# Patient Record
Sex: Female | Born: 1975 | Race: White | Hispanic: No | Marital: Single | Smoking: Current every day smoker
Health system: Southern US, Community
[De-identification: ages and names within clinical notes are randomized; demographics above are authoritative.]

## PROBLEM LIST (undated history)

## (undated) DIAGNOSIS — D649 Anemia, unspecified: Secondary | ICD-10-CM

## (undated) DIAGNOSIS — Z9884 Bariatric surgery status: Secondary | ICD-10-CM

## (undated) DIAGNOSIS — O24419 Gestational diabetes mellitus in pregnancy, unspecified control: Secondary | ICD-10-CM

## (undated) DIAGNOSIS — I25119 Atherosclerotic heart disease of native coronary artery with unspecified angina pectoris: Secondary | ICD-10-CM

## (undated) DIAGNOSIS — E039 Hypothyroidism, unspecified: Secondary | ICD-10-CM

## (undated) DIAGNOSIS — Z951 Presence of aortocoronary bypass graft: Secondary | ICD-10-CM

## (undated) DIAGNOSIS — Z8719 Personal history of other diseases of the digestive system: Secondary | ICD-10-CM

## (undated) DIAGNOSIS — E559 Vitamin D deficiency, unspecified: Secondary | ICD-10-CM

## (undated) DIAGNOSIS — F410 Panic disorder [episodic paroxysmal anxiety] without agoraphobia: Secondary | ICD-10-CM

## (undated) DIAGNOSIS — F419 Anxiety disorder, unspecified: Secondary | ICD-10-CM

## (undated) DIAGNOSIS — F329 Major depressive disorder, single episode, unspecified: Secondary | ICD-10-CM

## (undated) DIAGNOSIS — G8929 Other chronic pain: Secondary | ICD-10-CM

## (undated) DIAGNOSIS — D497 Neoplasm of unspecified behavior of endocrine glands and other parts of nervous system: Secondary | ICD-10-CM

## (undated) DIAGNOSIS — K469 Unspecified abdominal hernia without obstruction or gangrene: Secondary | ICD-10-CM

## (undated) DIAGNOSIS — R519 Headache, unspecified: Secondary | ICD-10-CM

## (undated) DIAGNOSIS — F32A Depression, unspecified: Secondary | ICD-10-CM

## (undated) DIAGNOSIS — Z8711 Personal history of peptic ulcer disease: Secondary | ICD-10-CM

## (undated) DIAGNOSIS — J45909 Unspecified asthma, uncomplicated: Secondary | ICD-10-CM

## (undated) DIAGNOSIS — S32000A Wedge compression fracture of unspecified lumbar vertebra, initial encounter for closed fracture: Secondary | ICD-10-CM

## (undated) DIAGNOSIS — M7071 Other bursitis of hip, right hip: Secondary | ICD-10-CM

## (undated) DIAGNOSIS — Z9289 Personal history of other medical treatment: Secondary | ICD-10-CM

## (undated) DIAGNOSIS — E785 Hyperlipidemia, unspecified: Secondary | ICD-10-CM

## (undated) DIAGNOSIS — R569 Unspecified convulsions: Secondary | ICD-10-CM

## (undated) DIAGNOSIS — K219 Gastro-esophageal reflux disease without esophagitis: Secondary | ICD-10-CM

## (undated) DIAGNOSIS — M549 Dorsalgia, unspecified: Secondary | ICD-10-CM

## (undated) DIAGNOSIS — G43909 Migraine, unspecified, not intractable, without status migrainosus: Secondary | ICD-10-CM

## (undated) DIAGNOSIS — M797 Fibromyalgia: Secondary | ICD-10-CM

## (undated) DIAGNOSIS — R825 Elevated urine levels of drugs, medicaments and biological substances: Secondary | ICD-10-CM

## (undated) DIAGNOSIS — I639 Cerebral infarction, unspecified: Secondary | ICD-10-CM

## (undated) DIAGNOSIS — J42 Unspecified chronic bronchitis: Secondary | ICD-10-CM

## (undated) DIAGNOSIS — M545 Low back pain: Secondary | ICD-10-CM

## (undated) DIAGNOSIS — M1990 Unspecified osteoarthritis, unspecified site: Secondary | ICD-10-CM

## (undated) DIAGNOSIS — Z720 Tobacco use: Secondary | ICD-10-CM

## (undated) DIAGNOSIS — J189 Pneumonia, unspecified organism: Secondary | ICD-10-CM

## (undated) DIAGNOSIS — M7072 Other bursitis of hip, left hip: Secondary | ICD-10-CM

## (undated) DIAGNOSIS — R011 Cardiac murmur, unspecified: Secondary | ICD-10-CM

## (undated) DIAGNOSIS — R51 Headache: Secondary | ICD-10-CM

## (undated) DIAGNOSIS — G4730 Sleep apnea, unspecified: Secondary | ICD-10-CM

## (undated) HISTORY — PX: HERNIA REPAIR: SHX51

## (undated) HISTORY — DX: Cerebral infarction, unspecified: I63.9

## (undated) HISTORY — DX: Hyperlipidemia, unspecified: E78.5

## (undated) HISTORY — DX: Unspecified convulsions: R56.9

## (undated) HISTORY — PX: ESOPHAGOGASTRODUODENOSCOPY: SHX1529

## (undated) HISTORY — PX: TONSILLECTOMY: SUR1361

## (undated) HISTORY — DX: Unspecified asthma, uncomplicated: J45.909

## (undated) HISTORY — DX: Hypothyroidism, unspecified: E03.9

## (undated) HISTORY — PX: ABDOMINAL HERNIA REPAIR: SHX539

---

## 2004-03-14 HISTORY — PX: GASTRIC BYPASS: SHX52

## 2005-03-14 HISTORY — PX: GASTRIC RESTRICTION SURGERY: SHX653

## 2005-03-14 HISTORY — PX: VAGINAL HYSTERECTOMY: SUR661

## 2014-08-14 ENCOUNTER — Emergency Department (HOSPITAL_COMMUNITY): Payer: Medicare Other

## 2014-08-14 ENCOUNTER — Encounter (HOSPITAL_COMMUNITY): Payer: Self-pay

## 2014-08-14 ENCOUNTER — Emergency Department (HOSPITAL_COMMUNITY)
Admission: EM | Admit: 2014-08-14 | Discharge: 2014-08-14 | Disposition: A | Discharge status: Home / Self Care | Payer: Medicare Other | Attending: Emergency Medicine | Admitting: Emergency Medicine

## 2014-08-14 DIAGNOSIS — F410 Panic disorder [episodic paroxysmal anxiety] without agoraphobia: Secondary | ICD-10-CM | POA: Diagnosis not present

## 2014-08-14 DIAGNOSIS — R531 Weakness: Secondary | ICD-10-CM | POA: Insufficient documentation

## 2014-08-14 DIAGNOSIS — Z8719 Personal history of other diseases of the digestive system: Secondary | ICD-10-CM | POA: Diagnosis not present

## 2014-08-14 DIAGNOSIS — R0789 Other chest pain: Secondary | ICD-10-CM | POA: Insufficient documentation

## 2014-08-14 DIAGNOSIS — Z720 Tobacco use: Secondary | ICD-10-CM | POA: Insufficient documentation

## 2014-08-14 DIAGNOSIS — F329 Major depressive disorder, single episode, unspecified: Secondary | ICD-10-CM | POA: Diagnosis not present

## 2014-08-14 DIAGNOSIS — R002 Palpitations: Secondary | ICD-10-CM | POA: Diagnosis not present

## 2014-08-14 DIAGNOSIS — E559 Vitamin D deficiency, unspecified: Secondary | ICD-10-CM | POA: Insufficient documentation

## 2014-08-14 HISTORY — DX: Anxiety disorder, unspecified: F41.9

## 2014-08-14 HISTORY — DX: Vitamin D deficiency, unspecified: E55.9

## 2014-08-14 HISTORY — DX: Depression, unspecified: F32.A

## 2014-08-14 HISTORY — DX: Panic disorder (episodic paroxysmal anxiety): F41.0

## 2014-08-14 HISTORY — DX: Unspecified abdominal hernia without obstruction or gangrene: K46.9

## 2014-08-14 HISTORY — DX: Major depressive disorder, single episode, unspecified: F32.9

## 2014-08-14 LAB — CBC WITH DIFFERENTIAL/PLATELET
BASOS PCT: 0 % (ref 0–1)
Basophils Absolute: 0 10*3/uL (ref 0.0–0.1)
Eosinophils Absolute: 0.4 10*3/uL (ref 0.0–0.7)
Eosinophils Relative: 4 % (ref 0–5)
HCT: 37.1 % (ref 36.0–46.0)
Hemoglobin: 12 g/dL (ref 12.0–15.0)
Lymphocytes Relative: 24 % (ref 12–46)
Lymphs Abs: 2.4 10*3/uL (ref 0.7–4.0)
MCH: 27.5 pg (ref 26.0–34.0)
MCHC: 32.3 g/dL (ref 30.0–36.0)
MCV: 85.1 fL (ref 78.0–100.0)
MONOS PCT: 7 % (ref 3–12)
Monocytes Absolute: 0.7 10*3/uL (ref 0.1–1.0)
NEUTROS ABS: 6.4 10*3/uL (ref 1.7–7.7)
Neutrophils Relative %: 65 % (ref 43–77)
Platelets: 391 10*3/uL (ref 150–400)
RBC: 4.36 MIL/uL (ref 3.87–5.11)
RDW: 16.1 % — ABNORMAL HIGH (ref 11.5–15.5)
WBC: 9.9 10*3/uL (ref 4.0–10.5)

## 2014-08-14 LAB — RAPID URINE DRUG SCREEN, HOSP PERFORMED
AMPHETAMINES: POSITIVE — AB
BARBITURATES: NOT DETECTED
Benzodiazepines: NOT DETECTED
Cocaine: NOT DETECTED
OPIATES: NOT DETECTED
TETRAHYDROCANNABINOL: POSITIVE — AB

## 2014-08-14 LAB — URINALYSIS, DIPSTICK ONLY
BILIRUBIN URINE: NEGATIVE
Glucose, UA: NEGATIVE mg/dL
Hgb urine dipstick: NEGATIVE
Ketones, ur: NEGATIVE mg/dL
LEUKOCYTES UA: NEGATIVE
Nitrite: NEGATIVE
PH: 7 (ref 5.0–8.0)
Protein, ur: NEGATIVE mg/dL
Specific Gravity, Urine: 1.023 (ref 1.005–1.030)
Urobilinogen, UA: 1 mg/dL (ref 0.0–1.0)

## 2014-08-14 LAB — COMPREHENSIVE METABOLIC PANEL
ALK PHOS: 119 U/L (ref 38–126)
ALT: 13 U/L — AB (ref 14–54)
AST: 20 U/L (ref 15–41)
Albumin: 4 g/dL (ref 3.5–5.0)
Anion gap: 12 (ref 5–15)
BUN: 20 mg/dL (ref 6–20)
CALCIUM: 9.3 mg/dL (ref 8.9–10.3)
CO2: 21 mmol/L — ABNORMAL LOW (ref 22–32)
CREATININE: 0.81 mg/dL (ref 0.44–1.00)
Chloride: 106 mmol/L (ref 101–111)
Glucose, Bld: 83 mg/dL (ref 65–99)
Potassium: 4.2 mmol/L (ref 3.5–5.1)
Sodium: 139 mmol/L (ref 135–145)
Total Bilirubin: 0.4 mg/dL (ref 0.3–1.2)
Total Protein: 8.2 g/dL — ABNORMAL HIGH (ref 6.5–8.1)

## 2014-08-14 LAB — I-STAT TROPONIN, ED: Troponin i, poc: 0 ng/mL (ref 0.00–0.08)

## 2014-08-14 MED ORDER — LORAZEPAM 1 MG PO TABS
1.0000 mg | ORAL_TABLET | Freq: Once | ORAL | Status: AC
  Administered 2014-08-14: 1 mg via ORAL
  Filled 2014-08-14: qty 1

## 2014-08-14 MED ORDER — LORAZEPAM 1 MG PO TABS: 1.0000 mg | ORAL_TABLET | Freq: Three times a day (TID) | ORAL | Status: DC | PRN

## 2014-08-14 NOTE — ED Provider Notes (Signed)
CSN: [id number]     Arrival date & time 08/14/14  1029 History   First MD Initiated Contact with Patient 08/14/14 1101     Chief Complaint  Patient presents with  . Palpitations     (Consider location/radiation/quality/duration/timing/severity/associated sxs/prior Treatment) HPI Candice Butler is a 39 y.o. 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 Patient states these vibrations started last night when she is trying to go to sleep. She feels tingly all over. She denies any particular pain, no shortness of breath. Feels like her heart is racing. She states she feels pulsations along the body. Patient reports moving from [place] recently, "to change life." Patient states while in [place] she was being treated for all of her complaints by primary care doctor. Patient states she was on chronic pain medications which included daily Dilaudid, morphine, Norco, and was taking Klonopin for anxiety. Patient states when she moved here she found a primary care doctor who refused to prescribe her those medications and eventually discharge her from her practice. Patient states she has not had those medications in 3 months.  Past Medical History  Diagnosis Date  . Panic attacks   . Anxiety   . Depression   . Hernia of abdominal cavity   . Vitamin D deficiency    Past Surgical History  Procedure Laterality Date  . Hernia repair    . Gastric bypass    . Abdominal hysterectomy    . Esophagogastroduodenoscopy     Family History  Problem Relation Age of Onset  . Heart failure Father   . Heart failure Sister    History  Substance Use Topics  . Smoking status: Current Every Day Smoker -- 1.00 packs/day    Types:  Cigarettes  . Smokeless tobacco: Never Used  . Alcohol Use: Yes     Comment: occasionally   OB History    No data available     Review of Systems  Constitutional: Positive for fatigue. Negative for fever and chills.  Respiratory: Positive for chest tightness. Negative for cough and shortness of breath.   Cardiovascular: Positive for chest pain and palpitations. Negative for leg swelling.  Gastrointestinal: Negative for nausea, vomiting, abdominal pain and diarrhea.  Genitourinary: Negative for dysuria and flank pain.  Musculoskeletal: Negative for myalgias, arthralgias, neck pain and neck stiffness.  Skin: Negative for rash.  Neurological: Positive for weakness. Negative for dizziness and headaches.  All other systems reviewed and are negative.     Allergies  Review of patient's allergies indicates not on file.  Home Medications   Prior to Admission medications   Not on File   BP 117/75 mmHg  Pulse 87  Temp(Src) 98.2 F (36.8 C)  Resp 18  Ht 5\' 7"  (1.702 m)  Wt 185 lb (83.915 kg)  BMI 28.97 kg/m2  SpO2 96% Physical Exam  Constitutional: She is oriented to person, place, and time. She appears well-developed and well-nourished. No distress.  HENT:  Head: Normocephalic.  Eyes: Conjunctivae are normal.  Neck: Neck supple.  Cardiovascular: Normal rate, regular rhythm and normal heart sounds.   Pulmonary/Chest: Effort normal and breath sounds normal. No respiratory distress. She has no wheezes. She has no rales.  Abdominal: Soft. Bowel sounds are normal. She exhibits no distension. There  is no tenderness. There is no rebound.  Musculoskeletal: She exhibits no edema.  Neurological: She is alert and oriented to person, place, and time.  Skin: Skin is warm and dry.  Psychiatric: She has a normal mood and affect. Her behavior is normal.  Nursing note and vitals reviewed.   ED Course  Procedures (including critical care time) Labs Review Labs Reviewed  CBC WITH  DIFFERENTIAL/PLATELET - Abnormal; Notable for the following:    RDW 16.1 (*)    All other components within normal limits  COMPREHENSIVE METABOLIC PANEL - Abnormal; Notable for the following:    CO2 21 (*)    Total Protein 8.2 (*)    ALT 13 (*)    All other components within normal limits  URINE RAPID DRUG SCREEN (HOSP PERFORMED) NOT AT [facility] - Abnormal; Notable for the following:    Amphetamines POSITIVE (*)    Tetrahydrocannabinol POSITIVE (*)    All other components within normal limits  URINALYSIS, DIPSTICK ONLY  I-STAT TROPOININ, ED    Imaging Review Dg Chest 2 View  08/14/2014   CLINICAL DATA:  Cardiac palpitations  EXAM: CHEST  2 VIEW  COMPARISON:  None.  FINDINGS: Lungs are clear. The heart size and pulmonary vascularity are normal. No adenopathy. There are surgical clips in the upper abdomen. There is mild sclerosis in the right proximal humerus.  IMPRESSION: No edema or consolidation. Mild sclerosis in the right proximal humerus. 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   Electronically Signed   By: Lowella Grip III M.D.   On: 08/14/2014 12:14     EKG Interpretation None      MDM   Final diagnoses:  Palpitations     patient has multiple complaints, main complaint is vibration sensation and palpitations. She states vibration is in the neck, chest, abdomen, private area. Patient appears to be anxious, she has flat affect. Some facial twitching noted as well. Patient apparently has been on morphine, Lortab, soma, Clonopin at home for 7 years in [place], now no one is willing to prescribe that to her. I have explained to her she'll need to get it into the pain medicine clinic. To also need to follow with psychiatry to get back on her mental health medications. Patient gives multiple bizarre symptoms, states she had gastric bypass and the physician "was narcoleptic and fell asleep during my surgery, and dropped in the clivus which is now lodged behind the heart." Chest x-ray shows no such  finding. Patient also complaining of multiple back fractures, pituitary tumor, multiple other medical diagnoses. I explained to her if she has such a wide list of medical problems she will need to help primary care doctor who can follow-up with her. Patient agreed. Her workup in emergency department is unremarkable, specifically I cannot explain her tingling sensation. I have given her some referrals and resources guide for close follow-up. 10 tablets of Ativan will be given for her anxiety. I have also provided her with a referral to pain management and psychiatry. VS normal at this time. Pt is stable for dc.   Filed Vitals:   08/14/14 1034 08/14/14 1152 08/14/14 1313 08/14/14 1411  BP: 139/89 117/75 116/74 102/78  Pulse: 120 87 79 80  Temp: 98.2 F (36.8 C)   98.1 F (36.7 C)  TempSrc:    Oral  Resp: 22 18  14   Height: 5\' 7"  (1.702 m)     Weight: 185 lb (83.915 kg)     SpO2: 100% 96% 97% 98%  Jeannett Senior, PA-C 08/16/14 [place], MD 08/16/14 2241

## 2014-08-14 NOTE — ED Notes (Signed)
Pt aware of urine sample 

## 2014-08-14 NOTE — ED Notes (Signed)
Patient states she has been out of her meds x 2 months and has been having palpitations, anxiety, panic attacks.

## 2014-08-14 NOTE — Discharge Instructions (Signed)
Avoid using any drugs. Ativan as prescribed as needed for palpitations. Follow up with primary care doctor.   Generalized Anxiety Disorder Generalized anxiety disorder (GAD) is a mental disorder. It interferes with life functions, including relationships, work, and school. GAD is different from normal anxiety, which everyone experiences at some point in their lives in response to specific life events and activities. Normal anxiety actually helps [place] prepare for and get through these life events and activities. Normal anxiety goes away after the event or activity is over.  GAD causes anxiety that is not necessarily related to specific events or activities. It also causes excess anxiety in proportion to specific events or activities. The anxiety associated with GAD is also difficult to control. GAD can vary from mild to severe. People with severe GAD can have intense waves of anxiety with physical symptoms (panic attacks).  SYMPTOMS The anxiety and worry associated with GAD are difficult to control. This anxiety and worry are related to many life events and activities and also occur more days than not for 6 months or longer. People with GAD also have three or more of the following symptoms (one or more in children):  Restlessness.   Fatigue.  Difficulty concentrating.   Irritability.  Muscle tension.  Difficulty sleeping or unsatisfying sleep. DIAGNOSIS GAD is diagnosed through an assessment by your health care provider. Your health care provider will ask you questions aboutyour mood,physical symptoms, and events in your life. Your health care provider may ask you about your medical history and use of alcohol or drugs, including prescription medicines. Your health care provider may also do a physical exam and blood tests. Certain medical conditions and the use of certain substances can cause symptoms similar to those associated with GAD. Your health care provider may refer you to a mental  health specialist for further evaluation. TREATMENT The following therapies are usually used to treat GAD:   Medication. Antidepressant medication usually is prescribed for long-term daily control. Antianxiety medicines may be added in severe cases, especially when panic attacks occur.   Talk therapy (psychotherapy). Certain types of talk therapy can be helpful in treating GAD by providing support, education, and guidance. A form of talk therapy called cognitive behavioral therapy can teach you healthy ways to think about and react to daily life events and activities.  Stress managementtechniques. These include yoga, meditation, and exercise and can be very helpful when they are practiced regularly. A mental health specialist can help determine which treatment is best for you. Some people see improvement with one therapy. However, other people require a combination of therapies. Document Released: 06/25/2012 Document Revised: 07/15/2013 Document Reviewed: 06/25/2012 [facility] Patient Information 2015 [place], [place]. This information is not intended to replace advice given to you by your health care provider. Make sure you discuss any questions you have with your health care provider. Palpitations A palpitation is the feeling that your heartbeat is irregular or is faster than normal. It may feel like your heart is fluttering or skipping a beat. Palpitations are usually not a serious problem. However, in some cases, you may need further medical evaluation. CAUSES  Palpitations can be caused by:  Smoking.  Caffeine or other stimulants, such as diet pills or energy drinks.  Alcohol.  Stress and anxiety.  Strenuous physical activity.  Fatigue.  Certain medicines.  Heart disease, especially if you have a history of irregular heart rhythms (arrhythmias), such as atrial fibrillation, atrial flutter, or supraventricular tachycardia.  An improperly working pacemaker or  defibrillator. DIAGNOSIS  To find the cause of your palpitations, your health care provider will take your medical history and perform a physical exam. Your health care provider may also have you take a test called an ambulatory electrocardiogram (ECG). An ECG records your heartbeat patterns over a 24-hour period. You may also have other tests, such as:  Transthoracic echocardiogram (TTE). During echocardiography, sound waves are used to evaluate how blood flows through your heart.  Transesophageal echocardiogram (TEE).  Cardiac monitoring. This allows your health care provider to monitor your heart rate and rhythm in real time.  Holter monitor. This is a portable device that records your heartbeat and can help diagnose heart arrhythmias. It allows your health care provider to track your heart activity for several days, if needed.  Stress tests by exercise or by giving medicine that makes the heart beat faster. TREATMENT  Treatment of palpitations depends on the cause of your symptoms and can vary greatly. Most cases of palpitations do not require any treatment other than time, relaxation, and monitoring your symptoms. Other causes, such as atrial fibrillation, atrial flutter, or supraventricular tachycardia, usually require further treatment. HOME CARE INSTRUCTIONS   Avoid:  Caffeinated coffee, tea, soft drinks, diet pills, and energy drinks.  Chocolate.  Alcohol.  Stop smoking if you smoke.  Reduce your stress and anxiety. Things that can help you relax include:  A method of controlling things in your body, such as your heartbeats, with your mind (biofeedback).  Yoga.  Meditation.  Physical activity such as swimming, jogging, or walking.  Get plenty of rest and sleep. SEEK MEDICAL CARE IF:   You continue to have a fast or irregular heartbeat beyond 24 hours.  Your palpitations occur more often. SEEK IMMEDIATE MEDICAL CARE IF:  You have chest pain or shortness of  breath.  You have a severe headache.  You feel dizzy or you faint. MAKE SURE YOU:  Understand these instructions.  Will watch your condition.  Will get help right away if you are not doing well or get worse. Document Released: 02/26/2000 Document Revised: 03/05/2013 Document Reviewed: 04/29/2011 [facility] Patient Information 2015 [place], [place]. This information is not intended to replace advice given to you by your health care provider. Make sure you discuss any questions you have with your health care provider.

## 2014-08-26 ENCOUNTER — Ambulatory Visit: Payer: Medicare Other | Attending: Family Medicine | Admitting: Family Medicine

## 2014-08-26 ENCOUNTER — Encounter: Payer: Self-pay | Admitting: Family Medicine

## 2014-08-26 ENCOUNTER — Other Ambulatory Visit: Payer: Self-pay

## 2014-08-26 VITALS — BP 128/89 | HR 95 | Temp 98.2°F | Resp 18 | Ht 67.0 in | Wt 222.2 lb

## 2014-08-26 DIAGNOSIS — R002 Palpitations: Secondary | ICD-10-CM | POA: Diagnosis present

## 2014-08-26 DIAGNOSIS — Z8673 Personal history of transient ischemic attack (TIA), and cerebral infarction without residual deficits: Secondary | ICD-10-CM | POA: Insufficient documentation

## 2014-08-26 DIAGNOSIS — R1030 Lower abdominal pain, unspecified: Secondary | ICD-10-CM | POA: Insufficient documentation

## 2014-08-26 DIAGNOSIS — R109 Unspecified abdominal pain: Secondary | ICD-10-CM | POA: Insufficient documentation

## 2014-08-26 MED ORDER — ESOMEPRAZOLE MAGNESIUM 40 MG PO CPDR: 40.0000 mg | DELAYED_RELEASE_CAPSULE | Freq: Two times a day (BID) | ORAL | Status: DC

## 2014-08-26 NOTE — Progress Notes (Signed)
Candice Butler, is a 39 y.o. female  LKG:[id number]  GUY:[id number]  DOB - 10/16/1975  CC:  Chief Complaint  Patient presents with  . Spasms       HPI: Candice Butler is a 39 y.o. female here today to establish medical care.She has a complicated history including chronic pain, anxiety, vitamin D def. GERD. She has had a couple of seizures several years ago and has a history of a stroke. Multiple abdominal surgeries including a GBP and reversal. She reports several hernies and history of a pituiatary tumor, multiple back injuries. She has some type of cardiac problem and was seen by a cardiologist in [place].Marland Kitchen She was seen in ED for "vibrations throughout her trunk, which she reports continues. She reports no one can see or feel these vibrations or pulsations and no diagnosis was made in ED. She was given Ativan for her anxiety and told to follow-up here. She is recently here from [place]. She has been on Dilaudid, MS, Narco for pain and Klonopin for anxiety. She has also been on Soma for spasms. She is currently taking per self report Albuterol prn, folic acid, zofran, topamax and Ambien. She request prescriptions for Soma, Klonipin and Nexium.While in ED she had a urine drug screen positive for amphetamines and cannabis. She reports not having her pain and anxiety medications for 3 months until she was given the Ativan in ED. Allergies  Allergen Reactions  . Bee Venom Anaphylaxis  . Ciprofloxacin Shortness Of Breath  . Ibuprofen Shortness Of Breath and Other (See Comments)    Throat closed up  . Antihistamines, Chlorpheniramine-Type Other (See Comments)    Throat closed up  . Aspirin Other (See Comments)    ulcers  . Boniva [Ibandronic Acid] Other (See Comments)    Hand swelling, muscles were sore  . Quetiapine Hives and Other (See Comments)    Hives, trouble breathing, throat closed up  . Sulfa Antibiotics Other (See Comments)    Throat closed up   Past Medical History  Diagnosis Date  .  Panic attacks   . Anxiety   . Depression   . Hernia of abdominal cavity   . Vitamin D deficiency    Current Outpatient Prescriptions on File Prior to Visit  Medication Sig Dispense Refill  . albuterol (PROVENTIL HFA;VENTOLIN HFA) 108 (90 BASE) MCG/ACT inhaler Inhale 2 puffs into the lungs every 6 (six) hours as needed for wheezing or shortness of breath.    . folic acid (FOLVITE) 1 MG tablet Take 1 mg by mouth daily.  1  . ondansetron (ZOFRAN-ODT) 8 MG disintegrating tablet Take 1 tablet by mouth every 8 (eight) hours as needed. n/v  0  . topiramate (TOPAMAX) 50 MG tablet Take 1 tablet by mouth 3 (three) times daily.  0  . zolpidem (AMBIEN) 10 MG tablet Take 10 mg by mouth at bedtime as needed. for sleep  0  . amitriptyline (ELAVIL) 50 MG tablet Take 3 tablets by mouth daily.  0  . carisoprodol (SOMA) 350 MG tablet Take 350 mg by mouth 3 (three) times daily.  0  . cholecalciferol (VITAMIN D) 1000 UNITS tablet Take 1,000 Units by mouth daily.    . citalopram (CELEXA) 10 MG tablet Take 1 tablet by mouth daily.    . clonazePAM (KLONOPIN) 1 MG tablet Take 1 tablet by mouth 4 (four) times daily as needed. anxiety  0  . CRESTOR 5 MG tablet Take 5 mg by mouth daily.  11  .  Cyanocobalamin 1000 MCG/ML KIT Inject 1,000 mcg as directed every 30 (thirty) days.    . ferrous sulfate 325 (65 FE) MG tablet Take 325 mg by mouth 2 (two) times daily with a meal.    . iron sucrose in sodium chloride 0.9 % 150 mL Inject 200 mg into the vein once a week.    . KLOR-CON M20 20 MEQ tablet Take 20 mEq by mouth daily.  11  . levothyroxine (SYNTHROID, LEVOTHROID) 75 MCG tablet Take 75 mcg by mouth daily.  0  . LORazepam (ATIVAN) 1 MG tablet Take 1 tablet (1 mg total) by mouth 3 (three) times daily as needed for anxiety. (Patient not taking: Reported on 08/26/2014) 10 tablet 0  . NEXIUM 40 MG capsule Take 1 capsule by mouth 2 (two) times daily.  0  . Vitamin D, Ergocalciferol, (DRISDOL) 50000 UNITS CAPS capsule Take 1  capsule by mouth once a week.  2   No current facility-administered medications on file prior to visit.   Family History  Problem Relation Age of Onset  . Heart failure Father   . Heart failure Sister    History   Social History  . Marital Status: Single    Spouse Name: N/A  . Number of Children: N/A  . Years of Education: N/A   Occupational History  . Not on file.   Social History Main Topics  . Smoking status: Current Every Day Smoker -- 1.00 packs/day    Types: Cigarettes  . Smokeless tobacco: Never Used  . Alcohol Use: Yes     Comment: occasionally  . Drug Use: No  . Sexual Activity: Not on file   Other Topics Concern  . Not on file   Social History Narrative    Review of Systems: Constitutional: Negative for fever, chills, appetite change, weight loss. Positive for  fatigue. HENT: Negative for ear pain, ear discharge.nose bleeds Eyes: Negative for pain, discharge, redness, itching and visual disturbance. Neck: Negative for pain, stiffness Respiratory: Positive for  cough, shortness of breath with exercise (asthma) Cardiovascular: Positive  for chest pain, palpitations and leg swelling. Gastrointestinal: Positive for abdominal distention, abdominal pain, nausea, vomiting, diarrhea. Genitourinary: Negative for dysuria,  frequency, hematuria, flank pain, Positive for urgency and occassional incontenience Musculoskeletal: Negative for back pain, joint pain, joint  swelling, arthralgia and gait problem.Negative for weakness.Positive for hip pain Neurological: Negative for dizziness, tremors, seizures, syncope,  numbness and headaches. Positive for perceived tremors, Positve for history of seizures, lightheadedness Hematological: Negative for easy bruising or bleeding Psychiatric/Behavioral: Positive for for depression, anxiety, decreased concentration, confusion   Objective:   Filed Vitals:   08/26/14 1024  BP: 128/89  Pulse: 95  Temp: 98.2 F (36.8 C)  Resp:  18    Physical Exam: Constitutional: Patient appears well-developed and well-nourished. No distress.She sits on the table rocking as she says this helps with the vibratory sensations. HENT: Normocephalic, atraumatic, External right and left ear normal. Oropharynx is clear and moist.  Eyes: Conjunctivae and EOM are normal. PERRLA, no scleral icterus. Neck: Normal ROM. Neck supple. No lymphadenopathy, No thyromegaly. CVS: RRR, S1/S2 +, no murmurs, no gallops, no rubs Pulmonary: Effort and breath sounds normal, no stridor, rhonchi, wheezes, rales.  Abdominal: Soft. Normoactive BS,, no distension, tenderness, rebound or guarding.  Musculoskeletal: Normal range of motion. No edema and no tenderness.  Neuro: Alert.Normal muscle tone coordination. Non-focal Skin: Skin is warm and dry. No rash noted. Not diaphoretic. No erythema. No pallor. Psychiatric: Normal mood and   affect. Behavior, judgment, thought content normal.  Lab Results  Component Value Date   WBC 9.9 08/14/2014   HGB 12.0 08/14/2014   HCT 37.1 08/14/2014   MCV 85.1 08/14/2014   PLT 391 08/14/2014   Lab Results  Component Value Date   CREATININE 0.81 08/14/2014   BUN 20 08/14/2014   NA 139 08/14/2014   K 4.2 08/14/2014   CL 106 08/14/2014   CO2 21* 08/14/2014    No results found for: HGBA1C Lipid Panel  No results found for: CHOL, TRIG, HDL, CHOLHDL, VLDL, LDLCALC     Assessment and plan:   1. Anxiety ? Cause of palpitations and vibratory sensation 2. Chronic pain  - I have explained that we do not prescribe narcotics at all and as a general rule do not prescribe medications for anxiety - Certainly I would not prescribe on the first visit. - Have had Roselyn Reef talk with her and she has recommended several sources for treatmnt for mental health issues.  GERD - Nexium 40 mg # 60 with 3 refills.  I have recommended she make an appointment with assigned PCP at earliest appt possible. She does have an appt with Dr.  Adrian Blackwater on June 29.        The patient was given clear instructions to go to ER or return to medical center if symptoms don't improve, worsen or new problems develop. The patient verbalized understanding. The patient was told to call to get lab results if they haven't heard anything in the next week.     This note has been created with Surveyor, quantity. Any transcriptional errors are unintentional.    Micheline Chapman, MSN, FNP-BC [employer] [facility], [place]   08/26/2014, 2:08 PM

## 2014-08-26 NOTE — Patient Instructions (Signed)
Make an appointment as soon as possible with provider here Try to get in to see mental health professional ASAP If necessary return ED before being seen here again.

## 2014-08-26 NOTE — Progress Notes (Signed)
Patient here for ED follow up where she was diagnosed with palpitations.  Patient complains of "vibrations/spasms" all over body that have been continuous since ED visit. Patient reports the worst discomfort/cramping is in lower abdomen/groin area as 6/10.  Patient denies chest pain, chest discomfort, chest tightness.  Patient currently smokes 1ppd and is not ready to quit smoking.  Patient requests refills on Carisoprodol, Clonazepam, and Nexium to help "calm the spasms."

## 2014-08-28 ENCOUNTER — Other Ambulatory Visit: Payer: Self-pay | Admitting: Family Medicine

## 2014-09-10 ENCOUNTER — Ambulatory Visit: Payer: Medicare Other | Attending: Family Medicine | Admitting: Family Medicine

## 2014-09-10 ENCOUNTER — Encounter: Payer: Self-pay | Admitting: Family Medicine

## 2014-09-10 VITALS — BP 124/85 | HR 107 | Temp 98.5°F | Resp 18 | Ht 67.0 in | Wt 225.0 lb

## 2014-09-10 DIAGNOSIS — R002 Palpitations: Secondary | ICD-10-CM | POA: Insufficient documentation

## 2014-09-10 DIAGNOSIS — R825 Elevated urine levels of drugs, medicaments and biological substances: Secondary | ICD-10-CM | POA: Diagnosis not present

## 2014-09-10 DIAGNOSIS — E039 Hypothyroidism, unspecified: Secondary | ICD-10-CM

## 2014-09-10 DIAGNOSIS — Z9884 Bariatric surgery status: Secondary | ICD-10-CM | POA: Diagnosis not present

## 2014-09-10 DIAGNOSIS — F17200 Nicotine dependence, unspecified, uncomplicated: Secondary | ICD-10-CM

## 2014-09-10 DIAGNOSIS — E559 Vitamin D deficiency, unspecified: Secondary | ICD-10-CM | POA: Insufficient documentation

## 2014-09-10 DIAGNOSIS — Z720 Tobacco use: Secondary | ICD-10-CM | POA: Insufficient documentation

## 2014-09-10 DIAGNOSIS — E785 Hyperlipidemia, unspecified: Secondary | ICD-10-CM | POA: Insufficient documentation

## 2014-09-10 HISTORY — DX: Bariatric surgery status: Z98.84

## 2014-09-10 HISTORY — DX: Hypothyroidism, unspecified: E03.9

## 2014-09-10 HISTORY — DX: Hyperlipidemia, unspecified: E78.5

## 2014-09-10 HISTORY — DX: Elevated urine levels of drugs, medicaments and biological substances: R82.5

## 2014-09-10 LAB — LIPID PANEL
Cholesterol: 530 mg/dL — ABNORMAL HIGH (ref 0–200)
HDL: 47 mg/dL (ref >=46)
LDL CALC: 433 mg/dL — AB (ref 0–99)
Total CHOL/HDL Ratio: 11.3 Ratio
Triglycerides: 249 mg/dL — ABNORMAL HIGH (ref <150)
VLDL: 50 mg/dL — ABNORMAL HIGH (ref 0–40)

## 2014-09-10 LAB — TSH: TSH: 0.567 u[IU]/mL (ref 0.350–4.500)

## 2014-09-10 MED ORDER — LEVOTHYROXINE SODIUM 75 MCG PO TABS: 75.0000 ug | ORAL_TABLET | Freq: Every day | ORAL | Status: DC

## 2014-09-10 NOTE — Patient Instructions (Addendum)
Ms. Sebastiani,  Thank you for coming in today.  Thank you for coming in today. It was a pleasure meeting you. I look forward to being your primary doctor.   1. Palpitations: normal heart and lung exam today except for elevated heart rate  08/14/2014 U tox positive for amphetamines and marijuana   Rechecking urine drug screen to rule out drugs of abuse Checking TSH to rule out over treated hypothyroidism  Recommend decaf coffee  I have refilled synthroid at current dose, but you will be called if TSH is abnormal which will require a dose adjustment   2. Also checking vit D and cholesterol  3. Pain treatment: options are tylenol #3 or tramadol for chronic pain in this office. We do not prescribe pain medicine along with chronic benzodiazepine use.   You will be called with lab results  F/u in 3-4 weeks for pain concerns   Dr. Adrian Blackwater

## 2014-09-10 NOTE — Progress Notes (Signed)
Establish Care with PCP Complaining of palpitation and body vibration

## 2014-09-10 NOTE — Assessment & Plan Note (Addendum)
A: hypothyroidism Med: compliant with synthroid  P:  Check TSH Refilled synthroid   Normal thyroid continue current dose of synthroid
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
## 2014-09-10 NOTE — Assessment & Plan Note (Signed)
Palpitations: normal heart and lung exam today except for elevated heart rate  08/14/2014 U tox positive for amphetamines and marijuana   Rechecking urine drug screen to rule out drugs of abuse Checking TSH to rule out over treated hypothyroidism  Recommend decaf coffee

## 2014-09-10 NOTE — Assessment & Plan Note (Addendum)
Palpitations: normal heart and lung exam today except for elevated heart rate  08/14/2014 U tox positive for amphetamines and marijuana   Rechecking urine drug screen to rule out drugs of abuse  UDS positive for marijuana, negative for amphetamines, recommend marijuana avoidance

## 2014-09-11 LAB — VITAMIN D 25 HYDROXY (VIT D DEFICIENCY, FRACTURES): VIT D 25 HYDROXY: 14 ng/mL — AB (ref 30–100)

## 2014-09-12 ENCOUNTER — Telehealth: Payer: Self-pay

## 2014-09-12 DIAGNOSIS — K219 Gastro-esophageal reflux disease without esophagitis: Secondary | ICD-10-CM

## 2014-09-12 NOTE — Telephone Encounter (Signed)
Nurse called patient, patient verified date of birth. Nurse called patient because insurance company sent letter explaining patients esomeprazole is only filled for 30 day supply and Insurance is requesting patient use Nexium DR or omeprazole. Patient explains she can only take "Nexium, the purple pill" because omeprazole makes her sick. Patient reports trying other medications in the past and she gets extremely sick with any other similar medication. Patient requesting klonopin for tremors. Medication last filled in [place]. Patient recently relocated here.

## 2014-09-13 LAB — DRUG SCREEN, URINE
AMPHETAMINE SCRN UR: NEGATIVE
BENZODIAZEPINES.: NEGATIVE
Barbiturate Quant, Ur: NEGATIVE
Cocaine Metabolites: NEGATIVE
Creatinine,U: 69.27 mg/dL
METHADONE: NEGATIVE
Marijuana Metabolite: POSITIVE — AB
Opiates: NEGATIVE
Phencyclidine (PCP): NEGATIVE
Propoxyphene: NEGATIVE

## 2014-09-16 DIAGNOSIS — K219 Gastro-esophageal reflux disease without esophagitis: Secondary | ICD-10-CM | POA: Insufficient documentation

## 2014-09-16 MED ORDER — NEXIUM 40 MG PO CPDR: 40.0000 mg | DELAYED_RELEASE_CAPSULE | Freq: Two times a day (BID) | ORAL | Status: DC

## 2014-09-16 MED ORDER — VITAMIN D (ERGOCALCIFEROL) 1.25 MG (50000 UNIT) PO CAPS: 50000.0000 [IU] | ORAL_CAPSULE | ORAL | Status: DC

## 2014-09-16 MED ORDER — ATORVASTATIN CALCIUM 40 MG PO TABS: 40.0000 mg | ORAL_TABLET | Freq: Every day | ORAL | Status: DC

## 2014-09-16 MED ORDER — VITAMIN D3 50 MCG (2000 UT) PO TABS: 2000.0000 [IU] | ORAL_TABLET | Freq: Every day | ORAL | Status: DC

## 2014-09-16 NOTE — Telephone Encounter (Signed)
LVM to return call.

## 2014-09-16 NOTE — Addendum Note (Signed)
Addended by: Boykin Nearing on: 09/16/2014 11:50 AM   Modules accepted: Orders, Medications

## 2014-09-16 NOTE — Telephone Encounter (Signed)
Reviewed Sent in nexium  Please inform patient that Dr. Adrian Blackwater does not prescribe klonopin or any BZ for tremor, options are beta blocker or if tremor due to anxiety possibly adding atarax to the celexa she already takes.    LVM to return call, no information left on VM

## 2014-09-16 NOTE — Telephone Encounter (Signed)
Reviewed Sent in nexium  Please inform patient that Dr. Adrian Blackwater does not prescribe klonopin or any BZ for tremor, options are beta blocker or if tremor due to anxiety possibly adding atarax to the celexa she already takes.

## 2014-09-16 NOTE — Assessment & Plan Note (Signed)
Very high cholesterol, we do not have crestor in our pharmacy ordered  lipitor 40 mg daily

## 2014-09-16 NOTE — Assessment & Plan Note (Addendum)
  Vit D def take vit D supplement of 50 K U weekly for 8 weeks then 2 K U daily of D3, vit D deficiency can cause palpitations

## 2014-09-16 NOTE — Telephone Encounter (Signed)
-----   Message from Boykin Nearing, MD sent at 09/16/2014 11:46 AM EDT ----- UDS positive for marijuana, negative for amphetamines, recommend marijuana avoidance Vit D def take vit D supplement of 50 K U weekly for 8 weeks then 2 K U daily of D3, vit D deficiency can cause palpitations  Normal thyroid continue current dose of synthroid  Very high cholesterol, we do not have crestor in our pharmacy ordered  lipitor 40 mg daily

## 2014-10-02 ENCOUNTER — Telehealth: Payer: Self-pay | Admitting: *Deleted

## 2014-10-02 DIAGNOSIS — F411 Generalized anxiety disorder: Secondary | ICD-10-CM

## 2014-10-03 DIAGNOSIS — F411 Generalized anxiety disorder: Secondary | ICD-10-CM | POA: Insufficient documentation

## 2014-10-03 NOTE — Assessment & Plan Note (Signed)
Pt wants a referral to psychiatrist who will prescribe her Klonopin; Dr. Darleene Cleaver will prescribe, if needed. She would need written referral from PCP

## 2014-10-03 NOTE — Telephone Encounter (Signed)
Referral placed, please inform patient

## 2014-10-06 ENCOUNTER — Telehealth: Payer: Self-pay

## 2014-10-06 NOTE — Telephone Encounter (Signed)
Patient called to request her blood work results, please f/u with pt.  °

## 2014-10-09 NOTE — Telephone Encounter (Signed)
UDS positive for marijuana, negative for amphetamines, recommend marijuana avoidance Vit D def take vit D supplement of 50 K U weekly for 8 weeks then 2 K U daily of D3, vit D deficiency can cause palpitations  Normal thyroid continue current dose of synthroid  Very high cholesterol, we do not have crestor in our pharmacy ordered lipitor 40 mg daily.    Pt aware of results

## 2014-10-14 ENCOUNTER — Encounter: Payer: Self-pay | Admitting: Family Medicine

## 2014-10-14 ENCOUNTER — Ambulatory Visit: Payer: Medicare Other | Attending: Family Medicine | Admitting: Family Medicine

## 2014-10-14 VITALS — BP 113/74 | HR 87 | Temp 98.9°F | Resp 16 | Ht 67.0 in | Wt 237.0 lb

## 2014-10-14 DIAGNOSIS — M545 Low back pain, unspecified: Secondary | ICD-10-CM

## 2014-10-14 DIAGNOSIS — G8929 Other chronic pain: Secondary | ICD-10-CM

## 2014-10-14 DIAGNOSIS — F909 Attention-deficit hyperactivity disorder, unspecified type: Secondary | ICD-10-CM | POA: Diagnosis not present

## 2014-10-14 DIAGNOSIS — R251 Tremor, unspecified: Secondary | ICD-10-CM | POA: Diagnosis not present

## 2014-10-14 DIAGNOSIS — F411 Generalized anxiety disorder: Secondary | ICD-10-CM

## 2014-10-14 DIAGNOSIS — F17210 Nicotine dependence, cigarettes, uncomplicated: Secondary | ICD-10-CM | POA: Insufficient documentation

## 2014-10-14 HISTORY — DX: Low back pain, unspecified: M54.50

## 2014-10-14 HISTORY — DX: Low back pain, unspecified: G89.29

## 2014-10-14 MED ORDER — CARISOPRODOL 350 MG PO TABS: 350.0000 mg | ORAL_TABLET | Freq: Three times a day (TID) | ORAL | Status: DC | PRN

## 2014-10-14 MED ORDER — ACETAMINOPHEN-CODEINE #3 300-30 MG PO TABS: 1.0000 | ORAL_TABLET | Freq: Three times a day (TID) | ORAL | Status: DC | PRN

## 2014-10-14 NOTE — Patient Instructions (Addendum)
Mrs. Manter,  Thank you for coming in today  1. Chronic low back pain: Will obtain medical records Refilled soma for muscle spasm and tremors  Tylenol #3 for pain  Referral to pain management Please go for low back x-ray  2. Anxiety: Please establish with mental healthcare in the area, see list provided  Call for medication refill F/u in 2 months for low back pain  Dr. Adrian Blackwater

## 2014-10-14 NOTE — Progress Notes (Signed)
F/U back pain and trimmers

## 2014-10-14 NOTE — Assessment & Plan Note (Signed)
Anxiety.  Please establish with mental healthcare in the area, see list provided

## 2014-10-14 NOTE — Assessment & Plan Note (Signed)
Chronic low back pain: Will obtain medical records Refilled soma for muscle spasm and tremors  Tylenol #3 for pain  Referral to pain management Please go for low back x-ray
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
## 2014-10-21 ENCOUNTER — Ambulatory Visit (HOSPITAL_COMMUNITY)
Admission: RE | Admit: 2014-10-21 | Discharge: 2014-10-21 | Disposition: A | Discharge status: Home / Self Care | Payer: Medicare Other | Source: Ambulatory Visit | Attending: Family Medicine | Admitting: Family Medicine

## 2014-10-21 DIAGNOSIS — M545 Low back pain: Secondary | ICD-10-CM | POA: Insufficient documentation

## 2014-10-21 DIAGNOSIS — G8929 Other chronic pain: Secondary | ICD-10-CM | POA: Insufficient documentation

## 2014-10-22 ENCOUNTER — Telehealth: Payer: Self-pay | Admitting: Family Medicine

## 2014-10-22 ENCOUNTER — Telehealth: Payer: Self-pay | Admitting: *Deleted

## 2014-10-22 DIAGNOSIS — M545 Low back pain: Principal | ICD-10-CM

## 2014-10-22 DIAGNOSIS — G8929 Other chronic pain: Secondary | ICD-10-CM

## 2014-10-22 NOTE — Telephone Encounter (Signed)
LVM to return call.

## 2014-10-22 NOTE — Telephone Encounter (Signed)
Patient was returning call from nurse. Please assist.

## 2014-10-22 NOTE — Telephone Encounter (Signed)
-----   Message from Boykin Nearing, MD sent at 10/21/2014  5:47 PM EDT ----- Low back x-ray  Chronic changes in L 3 consistent with compression fracture No new changes

## 2014-10-28 ENCOUNTER — Other Ambulatory Visit: Payer: Self-pay | Admitting: Family Medicine

## 2014-10-28 MED ORDER — CARISOPRODOL 350 MG PO TABS: 350.0000 mg | ORAL_TABLET | Freq: Three times a day (TID) | ORAL | Status: DC | PRN

## 2014-10-28 MED ORDER — ACETAMINOPHEN-CODEINE #3 300-30 MG PO TABS: 1.0000 | ORAL_TABLET | Freq: Three times a day (TID) | ORAL | Status: DC | PRN

## 2014-10-28 NOTE — Telephone Encounter (Signed)
Pt stated Rx was written for 26 days

## 2014-10-28 NOTE — Telephone Encounter (Signed)
Too early for soma and tylenol #3, patient will have to space out medicine to last 30 days

## 2014-10-28 NOTE — Telephone Encounter (Signed)
Refilled soma and tylenol #3. Tylenol #3 to be filled on or after 11/14/2014  Re-iterated with patient that tylenol #3 and soma 60 tabs to last 30 days is to be taken prn and not scheduled. Patient voiced understanding

## 2014-10-28 NOTE — Progress Notes (Signed)
HPI: 39 year old female for evaluation of palpitations. Laboratories June 2016 showed normal TSH, drug screen positive for amphetamines and marijuana, and normal potassium. Patient previously resided in [place]. She apparently has a history of anxiety and was treated with Klonopin for her cardiologist she states. Previous echocardiogram apparently showed "holes in her heart". No records available. She has multiple complaints. She feels total body vibrations. Occasional skips. She has chest aching at night. She notes some dyspnea. She also complains of back pain.  Current Outpatient Prescriptions  Medication Sig Dispense Refill  . acetaminophen-codeine (TYLENOL #3) 300-30 MG per tablet Take 1 tablet by mouth every 8 (eight) hours as needed for moderate pain. Please fill on or after 11/14/2014 60 tablet 0  . albuterol (PROVENTIL HFA;VENTOLIN HFA) 108 (90 BASE) MCG/ACT inhaler Inhale 2 puffs into the lungs every 6 (six) hours as needed for wheezing or shortness of breath.    Marland Kitchen amitriptyline (ELAVIL) 50 MG tablet Take 3 tablets by mouth daily.  0  . atorvastatin (LIPITOR) 40 MG tablet Take 1 tablet (40 mg total) by mouth daily. 90 tablet 3  . carisoprodol (SOMA) 350 MG tablet Take 1 tablet (350 mg total) by mouth 3 (three) times daily as needed for muscle spasms. 60 tablet 0  . Cholecalciferol (VITAMIN D3) 2000 UNITS TABS Take 2,000 Units by mouth daily. 30 tablet 11  . citalopram (CELEXA) 10 MG tablet Take 1 tablet by mouth daily.    . ferrous sulfate 325 (65 FE) MG tablet Take 325 mg by mouth 2 (two) times daily with a meal.    . folic acid (FOLVITE) 1 MG tablet Take 1 mg by mouth daily.  1  . levothyroxine (SYNTHROID, LEVOTHROID) 75 MCG tablet Take 1 tablet (75 mcg total) by mouth daily. 30 tablet 2  . NEXIUM 40 MG capsule Take 1 capsule (40 mg total) by mouth 2 (two) times daily. 60 capsule 5  . ondansetron (ZOFRAN-ODT) 8 MG disintegrating tablet Take 1 tablet by mouth every 8 (eight) hours as  needed. n/v  0  . Vitamin D, Ergocalciferol, (DRISDOL) 50000 UNITS CAPS capsule Take 1 capsule (50,000 Units total) by mouth every 7 (seven) days. For 8 weeks 8 capsule 0   No current facility-administered medications for this visit.    Allergies  Allergen Reactions  . Bee Venom Anaphylaxis  . Ciprofloxacin Shortness Of Breath  . Ibuprofen Shortness Of Breath and Other (See Comments)    Throat closed up  . Antihistamines, Chlorpheniramine-Type Other (See Comments)    Throat closed up  . Aspirin Other (See Comments)    ulcers  . Boniva [Ibandronic Acid] Other (See Comments)    Hand swelling, muscles were sore  . Quetiapine Hives and Other (See Comments)    Hives, trouble breathing, throat closed up  . Sulfa Antibiotics Other (See Comments)    Throat closed up     Past Medical History  Diagnosis Date  . Panic attacks Dx 2009  . Anxiety Dx 2009  . Depression Dx 2009  . Hernia of abdominal cavity Dx 2005  . Vitamin D deficiency   . Hypothyroid Dx 2015  . Stroke     per patient  . Seizure     per patient   . Hyperlipemia     per patient   . Asthma     Past Surgical History  Procedure Laterality Date  . Hernia repair    . Gastric bypass    . Abdominal hysterectomy    .  Esophagogastroduodenoscopy    . Tonsillectomy      Social History   Social History  . Marital Status: Single    Spouse Name: N/A  . Number of Children: 3  . Years of Education: N/A   Occupational History  . Not on file.   Social History Main Topics  . Smoking status: Current Every Day Smoker -- 1.00 packs/day    Types: Cigarettes  . Smokeless tobacco: Never Used  . Alcohol Use: 0.0 oz/week    0 Standard drinks or equivalent per week     Comment: occasionally  . Drug Use: Yes     Comment: marijuana  . Sexual Activity: Not on file   Other Topics Concern  . Not on file   Social History Narrative    Family History  Problem Relation Age of Onset  . Heart failure Father   . Heart  failure Sister   . Depression Mother   . Hyperlipidemia Father     ROS: back pain and hematuria (pt states she discussed with her primary care) but no fevers or chills, productive cough, hemoptysis, dysphasia, odynophagia, melena, hematochezia, dysuria, rash, seizure activity, orthopnea, PND, pedal edema, claudication. Remaining systems are negative.  Physical Exam:   Blood pressure 130/70, pulse 70, height 5\' 7"  (1.702 m), weight 234 lb 7 oz (106.34 kg).  General:  Well developed/well nourished in NAD Skin warm/dry Patient not depressed No peripheral clubbing Back-normal HEENT-normal/normal eyelids Neck supple/normal carotid upstroke bilaterally; no bruits; no JVD; no thyromegaly chest - CTA/ normal expansion CV - RRR/normal S1 and S2; no murmurs, rubs or gallops;  PMI nondisplaced Abdomen -NT/ND, no HSM, no mass, + bowel sounds, no bruit 2+ femoral pulses, no bruits Ext-no edema, chords, 2+ DP Neuro-grossly nonfocal  ECG 08/26/14-sinus rhythm with diffuse ST depression

## 2014-10-28 NOTE — Telephone Encounter (Signed)
Verified pt date of birth  Pt aware of Xray results  Verbalize understanding   Stated has diarrhea.  Pt is not sure if needed OV or needs to go to UC or can take OTC med, took Imodium today. Advised pt BRAT diet. If Sx worsen go to [facility]

## 2014-10-30 ENCOUNTER — Encounter: Payer: Self-pay | Admitting: Cardiology

## 2014-10-30 ENCOUNTER — Ambulatory Visit (INDEPENDENT_AMBULATORY_CARE_PROVIDER_SITE_OTHER): Payer: Medicare Other | Admitting: Cardiology

## 2014-10-30 ENCOUNTER — Encounter: Payer: Self-pay | Admitting: *Deleted

## 2014-10-30 VITALS — BP 130/70 | HR 70 | Ht 67.0 in | Wt 234.4 lb

## 2014-10-30 DIAGNOSIS — R072 Precordial pain: Secondary | ICD-10-CM | POA: Diagnosis not present

## 2014-10-30 DIAGNOSIS — R0600 Dyspnea, unspecified: Secondary | ICD-10-CM | POA: Diagnosis not present

## 2014-10-30 DIAGNOSIS — F17200 Nicotine dependence, unspecified, uncomplicated: Secondary | ICD-10-CM

## 2014-10-30 DIAGNOSIS — R079 Chest pain, unspecified: Secondary | ICD-10-CM | POA: Insufficient documentation

## 2014-10-30 DIAGNOSIS — E785 Hyperlipidemia, unspecified: Secondary | ICD-10-CM

## 2014-10-30 DIAGNOSIS — Z720 Tobacco use: Secondary | ICD-10-CM

## 2014-10-30 DIAGNOSIS — R002 Palpitations: Secondary | ICD-10-CM | POA: Diagnosis not present

## 2014-10-30 DIAGNOSIS — F411 Generalized anxiety disorder: Secondary | ICD-10-CM

## 2014-10-30 NOTE — Assessment & Plan Note (Signed)
Symptoms sound to be potentially premature beats. There is a question history of "holes in the heart". Obtain records from [place]. Schedule echocardiogram. Her main complaint is body vibrations which I do not think her cardiac related.

## 2014-10-30 NOTE — Assessment & Plan Note (Signed)
Symptoms are atypical. Baseline electrocardiogram abnormal and she cannot ambulate. Plan Lexiscan nuclear study for risk stratification.

## 2014-10-30 NOTE — Assessment & Plan Note (Signed)
I have asked patient to follow-up with primary care for further management.

## 2014-10-30 NOTE — Assessment & Plan Note (Signed)
Patient counseled on discontinuing. 

## 2014-10-30 NOTE — Assessment & Plan Note (Signed)
Continue statin. Management per primary care.

## 2014-10-30 NOTE — Patient Instructions (Signed)
Your physician wants you to follow-up in: [place] will receive a reminder letter in the mail two months in advance. If you don't receive a letter, please call our office to schedule the follow-up appointment.   Your physician has requested that you have a lexiscan myoview. For further information please visit [email or web address]. Please follow instruction sheet, as given.   Your physician has requested that you have an echocardiogram. Echocardiography is a painless test that uses sound waves to create images of your heart. It provides your doctor with information about the size and shape of your heart and how well your heart's chambers and valves are working. This procedure takes approximately one hour. There are no restrictions for this procedure.

## 2014-10-31 ENCOUNTER — Telehealth: Payer: Self-pay | Admitting: Cardiology

## 2014-10-31 NOTE — Telephone Encounter (Signed)
Faxed signed Release to Dr Joneen Roach to get records requested by Dr Stanford Breed.  Faxed to [phone].  Faxed on 10/31/14. lp

## 2014-11-10 ENCOUNTER — Telehealth (HOSPITAL_COMMUNITY): Payer: Self-pay

## 2014-11-10 NOTE — Telephone Encounter (Signed)
Patient given detailed instructions per Myocardial Perfusion Study Information Sheet for test on 11-12-2014 at 0900. Patient Notified to arrive at [facility] for echo, and that it is imperative to arrive on time for appointment to keep from having the test rescheduled. Patient verbalized understanding. Oletta Lamas, Ladaija Dimino A

## 2014-11-12 ENCOUNTER — Ambulatory Visit (HOSPITAL_BASED_OUTPATIENT_CLINIC_OR_DEPARTMENT_OTHER): Payer: Medicare Other

## 2014-11-12 ENCOUNTER — Other Ambulatory Visit (HOSPITAL_COMMUNITY): Payer: Medicare Other

## 2014-11-12 ENCOUNTER — Other Ambulatory Visit: Payer: Self-pay

## 2014-11-12 ENCOUNTER — Ambulatory Visit (HOSPITAL_COMMUNITY): Payer: Medicare Other | Attending: Cardiology

## 2014-11-12 ENCOUNTER — Encounter (HOSPITAL_COMMUNITY): Payer: Medicare Other

## 2014-11-12 DIAGNOSIS — E785 Hyperlipidemia, unspecified: Secondary | ICD-10-CM | POA: Diagnosis not present

## 2014-11-12 DIAGNOSIS — R002 Palpitations: Secondary | ICD-10-CM | POA: Insufficient documentation

## 2014-11-12 DIAGNOSIS — R0600 Dyspnea, unspecified: Secondary | ICD-10-CM | POA: Diagnosis not present

## 2014-11-12 DIAGNOSIS — R55 Syncope and collapse: Secondary | ICD-10-CM | POA: Insufficient documentation

## 2014-11-12 DIAGNOSIS — F17200 Nicotine dependence, unspecified, uncomplicated: Secondary | ICD-10-CM | POA: Insufficient documentation

## 2014-11-12 DIAGNOSIS — Z8249 Family history of ischemic heart disease and other diseases of the circulatory system: Secondary | ICD-10-CM | POA: Diagnosis not present

## 2014-11-12 DIAGNOSIS — I517 Cardiomegaly: Secondary | ICD-10-CM | POA: Diagnosis not present

## 2014-11-12 DIAGNOSIS — R072 Precordial pain: Secondary | ICD-10-CM

## 2014-11-12 DIAGNOSIS — R9439 Abnormal result of other cardiovascular function study: Secondary | ICD-10-CM | POA: Diagnosis not present

## 2014-11-12 LAB — MYOCARDIAL PERFUSION IMAGING
CHL CUP NUCLEAR SDS: 0
CHL CUP NUCLEAR SSS: 13
LHR: 0.38
LV dias vol: 123 mL
LV sys vol: 61 mL
Peak HR: 102 {beats}/min
Rest HR: 63 {beats}/min
SRS: 13
TID: 1.05

## 2014-11-12 MED ORDER — TECHNETIUM TC 99M SESTAMIBI GENERIC - CARDIOLITE
30.0000 | Freq: Once | INTRAVENOUS | Status: AC | PRN
  Administered 2014-11-12: 30 via INTRAVENOUS

## 2014-11-12 MED ORDER — TECHNETIUM TC 99M SESTAMIBI GENERIC - CARDIOLITE
10.8000 | Freq: Once | INTRAVENOUS | Status: AC | PRN
  Administered 2014-11-12: 11 via INTRAVENOUS

## 2014-11-12 MED ORDER — REGADENOSON 0.4 MG/5ML IV SOLN
0.4000 mg | Freq: Once | INTRAVENOUS | Status: AC
  Administered 2014-11-12: 0.4 mg via INTRAVENOUS

## 2014-11-13 ENCOUNTER — Ambulatory Visit (HOSPITAL_COMMUNITY): Payer: Medicare Other

## 2014-11-13 ENCOUNTER — Encounter (HOSPITAL_COMMUNITY): Payer: Medicare Other

## 2014-11-13 NOTE — Progress Notes (Signed)
Cardiology Office Note   Date:  11/14/2014   ID:  Candice Butler, DOB 1975/10/17, MRN [id number]  PCP:  Minerva Ends, MD  Cardiologist:  Dr. Kirk Ruths   Electrophysiologist:  n/a  Chief Complaint  Patient presents with  . Chest Pain    abnormal stress nuclear study.  . Labs Only     History of Present Illness: Candice Butler is a 39 y.o. female with a hx of palpitations.  Evaluated by Dr. Kirk Ruths 10/30/14.  She had multiple complaints including total body vibrations, palpitations, chest pain at night, dyspnea, back pain.  She noted a previous echo in [place] with "holes in her heart."  Records from [place] requested.  Echo, Myoview arranged.  Echo was normal.  Myoview demonstrated anterolateral, apical anterior and apical ischemia.  Study was felt to be intermediate risk and Dr. Stanford Breed requested the patient return for FU to arrange cardiac cath.    She is here today with her daughter. She tells me that she's been having chest tightness with radiation to her neck with associated nausea and diaphoresis and dyspnea for several months. It has gradually gotten worse. She has symptoms with exertion and at rest. Last episode was yesterday. She sleeps on 3 pillows. She sometimes awakens choking. She has mild ankle edema without significant change. She denies syncope.   Studies/Reports Reviewed Today:  Echo 11/12/14 EF 55-60%, no RWMA, normal diastolic function, mod LAE  Myoview 11/12/14 EF 51%, ant-lat, apical anterior and apical ischemia, Intermediate Risk   Past Medical History  Diagnosis Date  . Panic attacks Dx 2009  . Anxiety Dx 2009  . Depression Dx 2009  . Hernia of abdominal cavity Dx 2005  . Vitamin D deficiency   . Hypothyroid Dx 2015  . Stroke     per patient  . Seizure     per patient   . Hyperlipemia     per patient   . Asthma     Past Surgical History  Procedure Laterality Date  . Hernia repair    . Gastric bypass    . Abdominal hysterectomy    .  Esophagogastroduodenoscopy    . Tonsillectomy       Current Outpatient Prescriptions  Medication Sig Dispense Refill  . acetaminophen-codeine (TYLENOL #3) 300-30 MG per tablet Take 1 tablet by mouth every 8 (eight) hours as needed for moderate pain. Please fill on or after 11/14/2014 60 tablet 0  . albuterol (PROVENTIL HFA;VENTOLIN HFA) 108 (90 BASE) MCG/ACT inhaler Inhale 2 puffs into the lungs every 6 (six) hours as needed for wheezing or shortness of breath.    Marland Kitchen amitriptyline (ELAVIL) 50 MG tablet Take 3 tablets by mouth daily.  0  . atorvastatin (LIPITOR) 40 MG tablet Take 1 tablet (40 mg total) by mouth daily. 90 tablet 3  . carisoprodol (SOMA) 350 MG tablet Take 1 tablet (350 mg total) by mouth 3 (three) times daily as needed for muscle spasms. 60 tablet 0  . Cholecalciferol (VITAMIN D3) 2000 UNITS TABS Take 2,000 Units by mouth daily. 30 tablet 11  . citalopram (CELEXA) 10 MG tablet Take 1 tablet by mouth daily.    . ferrous sulfate 325 (65 FE) MG tablet Take 325 mg by mouth 2 (two) times daily with a meal.    . folic acid (FOLVITE) 1 MG tablet Take 1 mg by mouth daily.  1  . levothyroxine (SYNTHROID, LEVOTHROID) 75 MCG tablet Take 1 tablet (75 mcg total) by mouth daily. [place]  tablet 2  . NEXIUM 40 MG capsule Take 1 capsule (40 mg total) by mouth 2 (two) times daily. 60 capsule 5  . ondansetron (ZOFRAN-ODT) 8 MG disintegrating tablet Take 1 tablet by mouth every 8 (eight) hours as needed. n/v  0  . Vitamin D, Ergocalciferol, (DRISDOL) 50000 UNITS CAPS capsule Take 1 capsule (50,000 Units total) by mouth every 7 (seven) days. For 8 weeks 8 capsule 0  . aspirin EC 81 MG tablet Take 1 tablet (81 mg total) by mouth daily.    . metoprolol succinate (TOPROL-XL) 25 MG 24 hr tablet Take 1 tablet (25 mg total) by mouth daily. 30 tablet 11   No current facility-administered medications for this visit.    Allergies:   Bee venom; Ciprofloxacin; Ibuprofen; Antihistamines, chlorpheniramine-type;  Aspirin; Boniva; Quetiapine; and Sulfa antibiotics    Social History:  The patient  reports that she has been smoking Cigarettes.  She has been smoking about 1.00 pack per day. She has never used smokeless tobacco. She reports that she drinks alcohol. She reports that she uses illicit drugs.   Family History:  The patient's family history includes Depression in her mother; Heart failure in her father and sister; Hyperlipidemia in her father.    ROS:   Please see the history of present illness.   Review of Systems  All other systems reviewed and are negative.      PHYSICAL EXAM: VS:  BP 140/90 mmHg  Pulse 78  Ht 5\' 7"  (1.702 m)  Wt 239 lb 12.8 oz (108.773 kg)  BMI 37.55 kg/m2    Wt Readings from Last 3 Encounters:  11/14/14 239 lb 12.8 oz (108.773 kg)  11/12/14 234 lb (106.142 kg)  10/30/14 234 lb 7 oz (106.34 kg)     GEN: Well nourished, well developed, in no acute distress HEENT: normal Neck: no JVD, no carotid bruits, no masses Cardiac:  Normal S1/S2, RRR; no murmur ,  no rubs or gallops, no edema   Respiratory:  clear to auscultation bilaterally, no wheezing, rhonchi or rales. GI: soft, nontender, nondistended, + BS MS: no deformity or atrophy Skin: warm and dry  Neuro:  CNs II-XII intact, Strength and sensation are intact Psych: Normal affect   EKG:  EKG is ordered today.  It demonstrates:   NSR, HR 78, downsloping ST segments with T-wave inversions in 2, 3, aVF, V4-V6, no significant change when compared to prior tracing   Recent Labs: 08/14/2014: ALT 13*; BUN 20; Creatinine, Ser 0.81; Hemoglobin 12.0; Platelets 391; Potassium 4.2; Sodium 139 09/10/2014: TSH 0.567    Lipid Panel    Component Value Date/Time   CHOL 530* 09/10/2014 1050   TRIG 249* 09/10/2014 1050   HDL 47 09/10/2014 1050   CHOLHDL 11.3 09/10/2014 1050   VLDL 50* 09/10/2014 1050   LDLCALC 433* 09/10/2014 1050      ASSESSMENT AND PLAN:  Precordial pain:  She has significant chest  discomfort and symptoms that sound consistent with angina. Recent stress test is intermediate risk with suggestion of anterior wall ischemia. Cardiac catheterization has been recommended by Dr. Stanford Breed. Risks and benefits of cardiac catheterization have been discussed with the patient.  These include bleeding, infection, kidney damage, stroke, heart attack, death.  The patient understands these risks and is willing to proceed.  She reports questionable history of aspirin allergy.  For the most part, it sounds as though she's describing peptic ulcer disease exacerbated by excessive NSAID use.    -  Start ASA 81  mg QD  -  Take Claritin if ASA causes itching, etc.  -  Start NTG 0.4 mg prn  -  Start Toprol-XL 25 mg QD  -  Arrange LHC next week.  Patient knows to go to the ED if symptoms worsen.  Current smoker:  Cessation has been recommended.   HLD (hyperlipidemia):  Continue statin.   Palpitations:  Records from [place] requested.     Medication Changes: Current medicines are reviewed at length with the patient today.  Concerns regarding medicines are as outlined above.  The following changes have been made:   Discontinued Medications   No medications on file   Modified Medications   No medications on file   New Prescriptions   ASPIRIN EC 81 MG TABLET    Take 1 tablet (81 mg total) by mouth daily.   METOPROLOL SUCCINATE (TOPROL-XL) 25 MG 24 HR TABLET    Take 1 tablet (25 mg total) by mouth daily.    Labs/ tests ordered today include:   Orders Placed This Encounter  Procedures  . Basic Metabolic Panel (BMET)  . CBC w/Diff  . INR/PT  . EKG 12-Lead      Disposition:    FU with Dr. Kirk Ruths 2 weeks post cath.    Signed, Versie Starks, MHS 11/14/2014 10:00 AM    [facility] [place], [place],   [address] Phone: [phone]; Fax: [phone]

## 2014-11-14 ENCOUNTER — Encounter: Payer: Self-pay | Admitting: *Deleted

## 2014-11-14 ENCOUNTER — Telehealth: Payer: Self-pay | Admitting: *Deleted

## 2014-11-14 ENCOUNTER — Ambulatory Visit (INDEPENDENT_AMBULATORY_CARE_PROVIDER_SITE_OTHER): Payer: Medicare Other | Admitting: Physician Assistant

## 2014-11-14 ENCOUNTER — Encounter: Payer: Self-pay | Admitting: Physician Assistant

## 2014-11-14 VITALS — BP 140/90 | HR 78 | Ht 67.0 in | Wt 239.8 lb

## 2014-11-14 DIAGNOSIS — E785 Hyperlipidemia, unspecified: Secondary | ICD-10-CM

## 2014-11-14 DIAGNOSIS — R002 Palpitations: Secondary | ICD-10-CM

## 2014-11-14 DIAGNOSIS — Z720 Tobacco use: Secondary | ICD-10-CM

## 2014-11-14 DIAGNOSIS — R072 Precordial pain: Secondary | ICD-10-CM | POA: Diagnosis not present

## 2014-11-14 DIAGNOSIS — F17200 Nicotine dependence, unspecified, uncomplicated: Secondary | ICD-10-CM

## 2014-11-14 DIAGNOSIS — R9439 Abnormal result of other cardiovascular function study: Secondary | ICD-10-CM

## 2014-11-14 LAB — BASIC METABOLIC PANEL
BUN: 14 mg/dL (ref 6–23)
CALCIUM: 9.6 mg/dL (ref 8.4–10.5)
CO2: 24 mEq/L (ref 19–32)
Chloride: 109 mEq/L (ref 96–112)
Creatinine, Ser: 0.64 mg/dL (ref 0.40–1.20)
GFR: 109.11 mL/min (ref >60.00)
GLUCOSE: 84 mg/dL (ref 70–99)
Potassium: 4.2 mEq/L (ref 3.5–5.1)
SODIUM: 141 meq/L (ref 135–145)

## 2014-11-14 LAB — CBC WITH DIFFERENTIAL/PLATELET
BASOS ABS: 0 10*3/uL (ref 0.0–0.1)
Basophils Relative: 0.4 % (ref 0.0–3.0)
Eosinophils Absolute: 0.6 10*3/uL (ref 0.0–0.7)
Eosinophils Relative: 6.3 % — ABNORMAL HIGH (ref 0.0–5.0)
HCT: 35.3 % — ABNORMAL LOW (ref 36.0–46.0)
Hemoglobin: 11.4 g/dL — ABNORMAL LOW (ref 12.0–15.0)
LYMPHS ABS: 2.4 10*3/uL (ref 0.7–4.0)
Lymphocytes Relative: 23.8 % (ref 12.0–46.0)
MCHC: 32.4 g/dL (ref 30.0–36.0)
MCV: 85.1 fl (ref 78.0–100.0)
MONO ABS: 0.6 10*3/uL (ref 0.1–1.0)
Monocytes Relative: 6.3 % (ref 3.0–12.0)
NEUTROS PCT: 63.2 % (ref 43.0–77.0)
Neutro Abs: 6.3 10*3/uL (ref 1.4–7.7)
Platelets: 473 10*3/uL — ABNORMAL HIGH (ref 150.0–400.0)
RBC: 4.15 Mil/uL (ref 3.87–5.11)
RDW: 16.2 % — ABNORMAL HIGH (ref 11.5–15.5)
WBC: 9.9 10*3/uL (ref 4.0–10.5)

## 2014-11-14 LAB — PROTIME-INR
INR: 1 ratio (ref 0.8–1.0)
Prothrombin Time: 11.2 s (ref 9.6–13.1)

## 2014-11-14 MED ORDER — ASPIRIN EC 81 MG PO TBEC: 81.0000 mg | DELAYED_RELEASE_TABLET | Freq: Every day | ORAL | Status: DC

## 2014-11-14 MED ORDER — NITROGLYCERIN 0.4 MG SL SUBL: 0.4000 mg | SUBLINGUAL_TABLET | SUBLINGUAL | Status: DC | PRN

## 2014-11-14 MED ORDER — METOPROLOL SUCCINATE ER 25 MG PO TB24: 25.0000 mg | ORAL_TABLET | Freq: Every day | ORAL | Status: DC

## 2014-11-14 NOTE — Telephone Encounter (Signed)
Pt notified of lab results ok to proceed with cath. Pt advised to f/u w/PCP due to mild anemia. Pt states she picked up her NTG today w/relief. I advised pt if continues to have cp over wknd; to s/w On-call provider, advised call [phone]. Pt agreeble.

## 2014-11-14 NOTE — Patient Instructions (Signed)
Medication Instructions:  1. START ASPIRIN 81 MG DAILY  2. START TOPROL XL 25 MG DAILY  3. AN RX FOR NTG HAS BEEN SENT IN TODAY AND YOU HAVE BEEN ADVISED AS TO HOW AND WHEN TO USE NTG  4. IF YOU DO START HAVING SOME ITCHING AFTER STARTING THE ASPIRIN YOU MAY TRY CLARITIN 10 MG DAILY  Labwork: 1. BMET, CBC W/DIFF, PT/INR  Testing/Procedures: Your physician has requested that you have a cardiac catheterization 11/20/14. Cardiac catheterization is used to diagnose and/or treat various heart conditions. Doctors may recommend this procedure for a number of different reasons. The most common reason is to evaluate chest pain. Chest pain can be a symptom of coronary artery disease (CAD), and cardiac catheterization can show whether plaque is narrowing or blocking your heart's arteries. This procedure is also used to evaluate the valves, as well as measure the blood flow and oxygen levels in different parts of your heart. For further information please visit [facility]. Please follow instruction sheet, as given.  Follow-Up: YOU WILL NEED A FOLLOW UP WITH DR. CRENSHAW 2 WEEKS POST CATH  Any Other Special Instructions Will Be Listed Below (If Applicable).

## 2014-11-14 NOTE — Telephone Encounter (Signed)
Pt saw Brynda Rim. today and was scheduled for a cath for 9/8. I cb pt to apologize that I put the wrong time for procedure and arrival to Short Stay on her instruction letter. I advised procedure time is 10:30 and arrival time is 8:30. Pt said thank you.

## 2014-11-19 ENCOUNTER — Other Ambulatory Visit: Payer: Self-pay

## 2014-11-19 ENCOUNTER — Emergency Department (HOSPITAL_COMMUNITY): Payer: Medicare Other

## 2014-11-19 ENCOUNTER — Inpatient Hospital Stay (HOSPITAL_COMMUNITY)
Admission: EM | Admit: 2014-11-19 | Discharge: 2014-12-02 | DRG: 234 | Disposition: A | Discharge status: Home / Self Care | Payer: Medicare Other | Attending: Thoracic Surgery (Cardiothoracic Vascular Surgery) | Admitting: Thoracic Surgery (Cardiothoracic Vascular Surgery)

## 2014-11-19 ENCOUNTER — Telehealth: Payer: Self-pay | Admitting: Physician Assistant

## 2014-11-19 ENCOUNTER — Encounter (HOSPITAL_COMMUNITY): Payer: Self-pay | Admitting: Family Medicine

## 2014-11-19 DIAGNOSIS — I208 Other forms of angina pectoris: Secondary | ICD-10-CM | POA: Diagnosis present

## 2014-11-19 DIAGNOSIS — R9439 Abnormal result of other cardiovascular function study: Secondary | ICD-10-CM

## 2014-11-19 DIAGNOSIS — R079 Chest pain, unspecified: Secondary | ICD-10-CM | POA: Diagnosis not present

## 2014-11-19 DIAGNOSIS — R931 Abnormal findings on diagnostic imaging of heart and coronary circulation: Secondary | ICD-10-CM | POA: Diagnosis not present

## 2014-11-19 DIAGNOSIS — K5900 Constipation, unspecified: Secondary | ICD-10-CM | POA: Diagnosis not present

## 2014-11-19 DIAGNOSIS — F410 Panic disorder [episodic paroxysmal anxiety] without agoraphobia: Secondary | ICD-10-CM | POA: Diagnosis present

## 2014-11-19 DIAGNOSIS — Z8249 Family history of ischemic heart disease and other diseases of the circulatory system: Secondary | ICD-10-CM

## 2014-11-19 DIAGNOSIS — R000 Tachycardia, unspecified: Secondary | ICD-10-CM | POA: Diagnosis present

## 2014-11-19 DIAGNOSIS — N390 Urinary tract infection, site not specified: Secondary | ICD-10-CM | POA: Diagnosis not present

## 2014-11-19 DIAGNOSIS — Z888 Allergy status to other drugs, medicaments and biological substances status: Secondary | ICD-10-CM

## 2014-11-19 DIAGNOSIS — I2089 Other forms of angina pectoris: Secondary | ICD-10-CM | POA: Diagnosis present

## 2014-11-19 DIAGNOSIS — F411 Generalized anxiety disorder: Secondary | ICD-10-CM | POA: Diagnosis present

## 2014-11-19 DIAGNOSIS — R072 Precordial pain: Secondary | ICD-10-CM

## 2014-11-19 DIAGNOSIS — J45909 Unspecified asthma, uncomplicated: Secondary | ICD-10-CM | POA: Diagnosis present

## 2014-11-19 DIAGNOSIS — J9811 Atelectasis: Secondary | ICD-10-CM

## 2014-11-19 DIAGNOSIS — F1210 Cannabis abuse, uncomplicated: Secondary | ICD-10-CM | POA: Diagnosis present

## 2014-11-19 DIAGNOSIS — Z9884 Bariatric surgery status: Secondary | ICD-10-CM

## 2014-11-19 DIAGNOSIS — F1120 Opioid dependence, uncomplicated: Secondary | ICD-10-CM | POA: Diagnosis present

## 2014-11-19 DIAGNOSIS — E559 Vitamin D deficiency, unspecified: Secondary | ICD-10-CM | POA: Diagnosis present

## 2014-11-19 DIAGNOSIS — G4730 Sleep apnea, unspecified: Secondary | ICD-10-CM | POA: Diagnosis present

## 2014-11-19 DIAGNOSIS — M545 Low back pain: Secondary | ICD-10-CM | POA: Diagnosis present

## 2014-11-19 DIAGNOSIS — E785 Hyperlipidemia, unspecified: Secondary | ICD-10-CM | POA: Diagnosis not present

## 2014-11-19 DIAGNOSIS — F17210 Nicotine dependence, cigarettes, uncomplicated: Secondary | ICD-10-CM | POA: Diagnosis present

## 2014-11-19 DIAGNOSIS — Z720 Tobacco use: Secondary | ICD-10-CM | POA: Diagnosis present

## 2014-11-19 DIAGNOSIS — K219 Gastro-esophageal reflux disease without esophagitis: Secondary | ICD-10-CM | POA: Diagnosis present

## 2014-11-19 DIAGNOSIS — D649 Anemia, unspecified: Secondary | ICD-10-CM | POA: Diagnosis present

## 2014-11-19 DIAGNOSIS — R0789 Other chest pain: Secondary | ICD-10-CM | POA: Diagnosis present

## 2014-11-19 DIAGNOSIS — Z91030 Bee allergy status: Secondary | ICD-10-CM

## 2014-11-19 DIAGNOSIS — Z951 Presence of aortocoronary bypass graft: Secondary | ICD-10-CM

## 2014-11-19 DIAGNOSIS — B9620 Unspecified Escherichia coli [E. coli] as the cause of diseases classified elsewhere: Secondary | ICD-10-CM | POA: Diagnosis not present

## 2014-11-19 DIAGNOSIS — Z881 Allergy status to other antibiotic agents status: Secondary | ICD-10-CM

## 2014-11-19 DIAGNOSIS — E039 Hypothyroidism, unspecified: Secondary | ICD-10-CM | POA: Diagnosis present

## 2014-11-19 DIAGNOSIS — Z6839 Body mass index (BMI) 39.0-39.9, adult: Secondary | ICD-10-CM

## 2014-11-19 DIAGNOSIS — I25110 Atherosclerotic heart disease of native coronary artery with unstable angina pectoris: Principal | ICD-10-CM | POA: Diagnosis present

## 2014-11-19 DIAGNOSIS — E669 Obesity, unspecified: Secondary | ICD-10-CM | POA: Diagnosis present

## 2014-11-19 DIAGNOSIS — R112 Nausea with vomiting, unspecified: Secondary | ICD-10-CM | POA: Diagnosis present

## 2014-11-19 DIAGNOSIS — R002 Palpitations: Secondary | ICD-10-CM | POA: Diagnosis present

## 2014-11-19 DIAGNOSIS — I25119 Atherosclerotic heart disease of native coronary artery with unspecified angina pectoris: Secondary | ICD-10-CM | POA: Diagnosis present

## 2014-11-19 DIAGNOSIS — Z8711 Personal history of peptic ulcer disease: Secondary | ICD-10-CM

## 2014-11-19 DIAGNOSIS — R109 Unspecified abdominal pain: Secondary | ICD-10-CM

## 2014-11-19 DIAGNOSIS — R030 Elevated blood-pressure reading, without diagnosis of hypertension: Secondary | ICD-10-CM | POA: Diagnosis not present

## 2014-11-19 DIAGNOSIS — D62 Acute posthemorrhagic anemia: Secondary | ICD-10-CM | POA: Diagnosis not present

## 2014-11-19 DIAGNOSIS — R1084 Generalized abdominal pain: Secondary | ICD-10-CM | POA: Diagnosis not present

## 2014-11-19 DIAGNOSIS — Z01818 Encounter for other preprocedural examination: Secondary | ICD-10-CM

## 2014-11-19 DIAGNOSIS — E8770 Fluid overload, unspecified: Secondary | ICD-10-CM | POA: Diagnosis not present

## 2014-11-19 DIAGNOSIS — Z882 Allergy status to sulfonamides status: Secondary | ICD-10-CM

## 2014-11-19 DIAGNOSIS — I2510 Atherosclerotic heart disease of native coronary artery without angina pectoris: Secondary | ICD-10-CM

## 2014-11-19 DIAGNOSIS — G894 Chronic pain syndrome: Secondary | ICD-10-CM | POA: Diagnosis present

## 2014-11-19 DIAGNOSIS — I2582 Chronic total occlusion of coronary artery: Secondary | ICD-10-CM | POA: Diagnosis present

## 2014-11-19 DIAGNOSIS — G4700 Insomnia, unspecified: Secondary | ICD-10-CM | POA: Diagnosis present

## 2014-11-19 DIAGNOSIS — F329 Major depressive disorder, single episode, unspecified: Secondary | ICD-10-CM | POA: Diagnosis present

## 2014-11-19 DIAGNOSIS — Z7982 Long term (current) use of aspirin: Secondary | ICD-10-CM

## 2014-11-19 HISTORY — DX: Gestational diabetes mellitus in pregnancy, unspecified control: O24.419

## 2014-11-19 HISTORY — DX: Unspecified chronic bronchitis: J42

## 2014-11-19 HISTORY — DX: Headache, unspecified: R51.9

## 2014-11-19 HISTORY — DX: Gastro-esophageal reflux disease without esophagitis: K21.9

## 2014-11-19 HISTORY — DX: Presence of aortocoronary bypass graft: Z95.1

## 2014-11-19 HISTORY — DX: Tobacco use: Z72.0

## 2014-11-19 HISTORY — DX: Headache: R51

## 2014-11-19 HISTORY — DX: Elevated urine levels of drugs, medicaments and biological substances: R82.5

## 2014-11-19 HISTORY — DX: Unspecified osteoarthritis, unspecified site: M19.90

## 2014-11-19 HISTORY — DX: Dorsalgia, unspecified: M54.9

## 2014-11-19 HISTORY — DX: Sleep apnea, unspecified: G47.30

## 2014-11-19 HISTORY — DX: Other chronic pain: G89.29

## 2014-11-19 HISTORY — DX: Migraine, unspecified, not intractable, without status migrainosus: G43.909

## 2014-11-19 HISTORY — DX: Wedge compression fracture of unspecified lumbar vertebra, initial encounter for closed fracture: S32.000A

## 2014-11-19 HISTORY — DX: Other bursitis of hip, left hip: M70.72

## 2014-11-19 HISTORY — DX: Personal history of peptic ulcer disease: Z87.11

## 2014-11-19 HISTORY — DX: Personal history of other medical treatment: Z92.89

## 2014-11-19 HISTORY — DX: Hyperlipidemia, unspecified: E78.5

## 2014-11-19 HISTORY — DX: Anemia, unspecified: D64.9

## 2014-11-19 HISTORY — DX: Personal history of other diseases of the digestive system: Z87.19

## 2014-11-19 HISTORY — DX: Pneumonia, unspecified organism: J18.9

## 2014-11-19 HISTORY — DX: Bariatric surgery status: Z98.84

## 2014-11-19 HISTORY — DX: Neoplasm of unspecified behavior of endocrine glands and other parts of nervous system: D49.7

## 2014-11-19 HISTORY — DX: Fibromyalgia: M79.7

## 2014-11-19 HISTORY — DX: Atherosclerotic heart disease of native coronary artery with unspecified angina pectoris: I25.119

## 2014-11-19 HISTORY — DX: Cardiac murmur, unspecified: R01.1

## 2014-11-19 HISTORY — DX: Hypothyroidism, unspecified: E03.9

## 2014-11-19 HISTORY — DX: Other bursitis of hip, right hip: M70.71

## 2014-11-19 HISTORY — DX: Low back pain: M54.5

## 2014-11-19 LAB — CBC
HCT: 34.7 % — ABNORMAL LOW (ref 36.0–46.0)
HEMATOCRIT: 36.2 % (ref 36.0–46.0)
Hemoglobin: 11.4 g/dL — ABNORMAL LOW (ref 12.0–15.0)
Hemoglobin: 10.7 g/dL — ABNORMAL LOW (ref 12.0–15.0)
MCH: 27.7 pg (ref 26.0–34.0)
MCH: 26.6 pg (ref 26.0–34.0)
MCHC: 31.5 g/dL (ref 30.0–36.0)
MCHC: 30.8 g/dL (ref 30.0–36.0)
MCV: 87.9 fL (ref 78.0–100.0)
MCV: 86.3 fL (ref 78.0–100.0)
PLATELETS: 424 10*3/uL — AB (ref 150–400)
Platelets: 389 10*3/uL (ref 150–400)
RBC: 4.12 MIL/uL (ref 3.87–5.11)
RBC: 4.02 MIL/uL (ref 3.87–5.11)
RDW: 15.1 % (ref 11.5–15.5)
RDW: 14.9 % (ref 11.5–15.5)
WBC: 10.5 10*3/uL (ref 4.0–10.5)
WBC: 10.3 10*3/uL (ref 4.0–10.5)

## 2014-11-19 LAB — BASIC METABOLIC PANEL
Anion gap: 8 (ref 5–15)
BUN: 19 mg/dL (ref 6–20)
CHLORIDE: 102 mmol/L (ref 101–111)
CO2: 27 mmol/L (ref 22–32)
Calcium: 8.8 mg/dL — ABNORMAL LOW (ref 8.9–10.3)
Creatinine, Ser: 0.76 mg/dL (ref 0.44–1.00)
GFR calc Af Amer: >60 mL/min (ref >60)
GFR calc non Af Amer: >60 mL/min (ref >60)
GLUCOSE: 87 mg/dL (ref 65–99)
POTASSIUM: 4.3 mmol/L (ref 3.5–5.1)
Sodium: 137 mmol/L (ref 135–145)

## 2014-11-19 LAB — TROPONIN I: Troponin I: <0.03 ng/mL (ref <0.031)

## 2014-11-19 LAB — HEPATIC FUNCTION PANEL
ALBUMIN: 3.5 g/dL (ref 3.5–5.0)
ALK PHOS: 116 U/L (ref 38–126)
ALT: 26 U/L (ref 14–54)
AST: 31 U/L (ref 15–41)
BILIRUBIN TOTAL: 0.3 mg/dL (ref 0.3–1.2)
Bilirubin, Direct: 0.2 mg/dL (ref 0.1–0.5)
Indirect Bilirubin: 0.1 mg/dL — ABNORMAL LOW (ref 0.3–0.9)
Total Protein: 7.5 g/dL (ref 6.5–8.1)

## 2014-11-19 LAB — CREATININE, SERUM
Creatinine, Ser: 0.78 mg/dL (ref 0.44–1.00)
GFR calc Af Amer: >60 mL/min (ref >60)
GFR calc non Af Amer: >60 mL/min (ref >60)

## 2014-11-19 LAB — I-STAT TROPONIN, ED: TROPONIN I, POC: 0 ng/mL (ref 0.00–0.08)

## 2014-11-19 LAB — LIPASE, BLOOD: Lipase: 24 U/L (ref 22–51)

## 2014-11-19 MED ORDER — FOLIC ACID 1 MG PO TABS
1.0000 mg | ORAL_TABLET | Freq: Every evening | ORAL | Status: DC
  Administered 2014-11-19 – 2014-11-25 (×7): 1 mg via ORAL
  Filled 2014-11-19 (×7): qty 1

## 2014-11-19 MED ORDER — CITALOPRAM HYDROBROMIDE 10 MG PO TABS
10.0000 mg | ORAL_TABLET | Freq: Every day | ORAL | Status: DC
  Administered 2014-11-19 – 2014-11-25 (×7): 10 mg via ORAL
  Filled 2014-11-19 (×7): qty 1

## 2014-11-19 MED ORDER — CARISOPRODOL 350 MG PO TABS
350.0000 mg | ORAL_TABLET | Freq: Three times a day (TID) | ORAL | Status: DC | PRN
  Administered 2014-11-19 – 2014-11-26 (×17): 350 mg via ORAL
  Filled 2014-11-19 (×17): qty 1

## 2014-11-19 MED ORDER — ALBUTEROL SULFATE (2.5 MG/3ML) 0.083% IN NEBU: 2.0000 mL | INHALATION_SOLUTION | Freq: Four times a day (QID) | RESPIRATORY_TRACT | Status: DC | PRN

## 2014-11-19 MED ORDER — SODIUM CHLORIDE 0.9 % IJ SOLN
3.0000 mL | Freq: Two times a day (BID) | INTRAMUSCULAR | Status: DC
  Administered 2014-11-20 (×2): 3 mL via INTRAVENOUS

## 2014-11-19 MED ORDER — ACETAMINOPHEN 325 MG PO TABS: 650.0000 mg | ORAL_TABLET | ORAL | Status: DC | PRN

## 2014-11-19 MED ORDER — ASPIRIN 81 MG PO CHEW
81.0000 mg | CHEWABLE_TABLET | ORAL | Status: AC
  Administered 2014-11-20: 81 mg via ORAL
  Filled 2014-11-19: qty 1

## 2014-11-19 MED ORDER — HEPARIN SODIUM (PORCINE) 5000 UNIT/ML IJ SOLN
5000.0000 [IU] | Freq: Three times a day (TID) | INTRAMUSCULAR | Status: DC
  Administered 2014-11-19 – 2014-11-25 (×16): 5000 [IU] via SUBCUTANEOUS
  Filled 2014-11-19 (×16): qty 1

## 2014-11-19 MED ORDER — SODIUM CHLORIDE 0.9 % IV SOLN: 250.0000 mL | INTRAVENOUS | Status: DC | PRN

## 2014-11-19 MED ORDER — METOPROLOL SUCCINATE ER 25 MG PO TB24
25.0000 mg | ORAL_TABLET | Freq: Every day | ORAL | Status: DC
  Administered 2014-11-19 – 2014-11-23 (×5): 25 mg via ORAL
  Filled 2014-11-19 (×5): qty 1

## 2014-11-19 MED ORDER — SODIUM CHLORIDE 0.9 % IV SOLN
INTRAVENOUS | Status: DC
  Administered 2014-11-20: 05:00:00 via INTRAVENOUS

## 2014-11-19 MED ORDER — MORPHINE SULFATE (PF) 4 MG/ML IV SOLN
4.0000 mg | Freq: Once | INTRAVENOUS | Status: AC
  Administered 2014-11-19: 4 mg via INTRAVENOUS
  Filled 2014-11-19: qty 1

## 2014-11-19 MED ORDER — ACETAMINOPHEN-CODEINE #3 300-30 MG PO TABS
1.0000 | ORAL_TABLET | Freq: Three times a day (TID) | ORAL | Status: DC | PRN
  Administered 2014-11-19 – 2014-11-26 (×14): 1 via ORAL
  Filled 2014-11-19 (×15): qty 1

## 2014-11-19 MED ORDER — SODIUM CHLORIDE 0.9 % IJ SOLN: 3.0000 mL | INTRAMUSCULAR | Status: DC | PRN

## 2014-11-19 MED ORDER — VITAMIN D 1000 UNITS PO TABS
2000.0000 [IU] | ORAL_TABLET | Freq: Every day | ORAL | Status: DC
  Administered 2014-11-19 – 2014-11-25 (×7): 2000 [IU] via ORAL
  Filled 2014-11-19 (×8): qty 2

## 2014-11-19 MED ORDER — ONDANSETRON 8 MG PO TBDP
8.0000 mg | ORAL_TABLET | Freq: Three times a day (TID) | ORAL | Status: DC | PRN
  Administered 2014-11-20 – 2014-11-22 (×2): 8 mg via ORAL
  Filled 2014-11-19 (×6): qty 1

## 2014-11-19 MED ORDER — ASPIRIN EC 81 MG PO TBEC
81.0000 mg | DELAYED_RELEASE_TABLET | Freq: Every day | ORAL | Status: DC
  Administered 2014-11-19 – 2014-11-25 (×7): 81 mg via ORAL
  Filled 2014-11-19 (×7): qty 1

## 2014-11-19 MED ORDER — AMITRIPTYLINE HCL 25 MG PO TABS
150.0000 mg | ORAL_TABLET | Freq: Every day | ORAL | Status: DC
  Administered 2014-11-19 – 2014-11-25 (×7): 150 mg via ORAL
  Filled 2014-11-19 (×7): qty 6

## 2014-11-19 MED ORDER — ASPIRIN 325 MG PO TABS
325.0000 mg | ORAL_TABLET | Freq: Once | ORAL | Status: AC
  Administered 2014-11-19: 325 mg via ORAL
  Filled 2014-11-19: qty 1

## 2014-11-19 MED ORDER — HYDROCODONE-ACETAMINOPHEN 5-325 MG PO TABS
1.0000 | ORAL_TABLET | Freq: Four times a day (QID) | ORAL | Status: DC | PRN
  Administered 2014-11-19 – 2014-11-21 (×8): 2 via ORAL
  Administered 2014-11-22: 1 via ORAL
  Administered 2014-11-22: 2 via ORAL
  Administered 2014-11-22: 1 via ORAL
  Administered 2014-11-22 – 2014-11-26 (×9): 2 via ORAL
  Filled 2014-11-19: qty 1
  Filled 2014-11-19 (×2): qty 2
  Filled 2014-11-19 (×2): qty 1
  Filled 2014-11-19 (×6): qty 2
  Filled 2014-11-19: qty 1
  Filled 2014-11-19 (×10): qty 2

## 2014-11-19 MED ORDER — PANTOPRAZOLE SODIUM 40 MG PO TBEC
80.0000 mg | DELAYED_RELEASE_TABLET | Freq: Every day | ORAL | Status: DC
  Administered 2014-11-19: 80 mg via ORAL
  Filled 2014-11-19: qty 2

## 2014-11-19 MED ORDER — NITROGLYCERIN 0.4 MG SL SUBL
0.4000 mg | SUBLINGUAL_TABLET | SUBLINGUAL | Status: DC | PRN
  Administered 2014-11-24: 0.4 mg via SUBLINGUAL
  Filled 2014-11-19: qty 1

## 2014-11-19 MED ORDER — ONDANSETRON HCL 4 MG/2ML IJ SOLN
4.0000 mg | Freq: Four times a day (QID) | INTRAMUSCULAR | Status: DC | PRN
  Administered 2014-11-20 – 2014-11-26 (×15): 4 mg via INTRAVENOUS
  Filled 2014-11-19 (×16): qty 2

## 2014-11-19 MED ORDER — LEVOTHYROXINE SODIUM 75 MCG PO TABS
75.0000 ug | ORAL_TABLET | Freq: Every evening | ORAL | Status: DC
Start: 2014-11-19 — End: 2014-12-02
  Administered 2014-11-19 – 2014-12-01 (×12): 75 ug via ORAL
  Filled 2014-11-19 (×3): qty 1
  Filled 2014-11-19: qty 3
  Filled 2014-11-19 (×3): qty 1
  Filled 2014-11-19: qty 3
  Filled 2014-11-19 (×4): qty 1
  Filled 2014-11-19 (×3): qty 3

## 2014-11-19 MED ORDER — ATORVASTATIN CALCIUM 40 MG PO TABS
40.0000 mg | ORAL_TABLET | Freq: Every day | ORAL | Status: DC
  Administered 2014-11-19 – 2014-11-25 (×7): 40 mg via ORAL
  Filled 2014-11-19 (×7): qty 1

## 2014-11-19 NOTE — ED Notes (Signed)
Pt here for pressure in chest pain. sts she has taken multiple nitro with some relief. sts hurts to take a deep breath and SOB. Pt scheduled for cath tomorrow.

## 2014-11-19 NOTE — ED Provider Notes (Signed)
CSN: [id number]     Arrival date & time 11/19/14  2706 History   First MD Initiated Contact with Patient 11/19/14 [phone]     Chief Complaint  Patient presents with  . Chest Pain      HPI Patient is scheduled for left heart catheter tomorrow for an abnormal stress test and a history of recurrent chest pain.  Patient is strong family history of cardiac disease.  She presents now with nearly 2 days of constant anterior chest pain.  There is some component of it that is pleuritic in nature.  Mild shortness of breath.  She states her symptoms worsen when she ambulates.  No history DVT or pulmonary embolism.  Her pain is mild to moderate in severity.  She reports some radiation of the pain towards her bilateral shoulder blades.  She describes the discomfort as a pressure in her chest.  She reports some nausea and vomiting yesterday without significant abdominal pain.  Denies diarrhea.  No nausea at this time.   Past Medical History  Diagnosis Date  . Panic attacks Dx 2009  . Anxiety Dx 2009  . Depression Dx 2009  . Hernia of abdominal cavity Dx 2005  . Vitamin D deficiency   . Hypothyroid Dx 2015  . Stroke     per patient  . Seizure     per patient   . Hyperlipemia     per patient   . Asthma    Past Surgical History  Procedure Laterality Date  . Hernia repair    . Gastric bypass    . Abdominal hysterectomy    . Esophagogastroduodenoscopy    . Tonsillectomy     Family History  Problem Relation Age of Onset  . Heart failure Father   . Heart failure Sister   . Depression Mother   . Hyperlipidemia Father    Social History  Substance Use Topics  . Smoking status: Current Every Day Smoker -- 1.00 packs/day    Types: Cigarettes  . Smokeless tobacco: Never Used  . Alcohol Use: 0.0 oz/week    0 Standard drinks or equivalent per week     Comment: occasionally   OB History    No data available     Review of Systems  All other systems reviewed and are negative.     Allergies   Bee venom; Ciprofloxacin; Ibuprofen; Antihistamines, chlorpheniramine-type; Boniva; Quetiapine; and Sulfa antibiotics  Home Medications   Prior to Admission medications   Medication Sig Start Date End Date Taking? Authorizing Provider  acetaminophen-codeine (TYLENOL #3) 300-30 MG per tablet Take 1 tablet by mouth every 8 (eight) hours as needed for moderate pain. Please fill on or after 11/14/2014 10/28/14  Yes Josalyn Funches, MD  albuterol (PROVENTIL HFA;VENTOLIN HFA) 108 (90 BASE) MCG/ACT inhaler Inhale 2 puffs into the lungs every 6 (six) hours as needed for wheezing or shortness of breath.   Yes Historical Provider, MD  amitriptyline (ELAVIL) 50 MG tablet Take 3 tablets by mouth daily. 07/21/14  Yes Historical Provider, MD  aspirin EC 81 MG tablet Take 1 tablet (81 mg total) by mouth daily. 11/14/14  Yes Scott Joylene Draft, PA-C  atorvastatin (LIPITOR) 40 MG tablet Take 1 tablet (40 mg total) by mouth daily. 09/16/14  Yes Boykin Nearing, MD  carisoprodol (SOMA) 350 MG tablet Take 1 tablet (350 mg total) by mouth 3 (three) times daily as needed for muscle spasms. 10/28/14  Yes Boykin Nearing, MD  Cholecalciferol (VITAMIN D3) 2000 UNITS TABS Take 2,000  Units by mouth daily. 09/16/14  Yes Josalyn Funches, MD  citalopram (CELEXA) 10 MG tablet Take 10 mg by mouth daily.  08/09/14  Yes Historical Provider, MD  folic acid (FOLVITE) 1 MG tablet Take 1 mg by mouth every evening.   Yes Historical Provider, MD  levothyroxine (SYNTHROID, LEVOTHROID) 75 MCG tablet Take 1 tablet (75 mcg total) by mouth daily. Patient taking differently: Take 75 mcg by mouth every evening.  09/10/14  Yes Josalyn Funches, MD  metoprolol succinate (TOPROL-XL) 25 MG 24 hr tablet Take 1 tablet (25 mg total) by mouth daily. 11/14/14  Yes Scott Joylene Draft, PA-C  Naphazoline HCl (CLEAR EYES OP) Place 1 drop into both eyes daily as needed (red or itchy eyes).   Yes Historical Provider, MD  NEXIUM 40 MG capsule Take 1 capsule (40 mg total) by mouth 2  (two) times daily. 09/16/14  Yes Josalyn Funches, MD  nitroGLYCERIN (NITROSTAT) 0.4 MG SL tablet Place 1 tablet (0.4 mg total) under the tongue every 5 (five) minutes as needed for chest pain. 11/14/14  Yes Scott T Kathlen Mody, PA-C  ondansetron (ZOFRAN-ODT) 8 MG disintegrating tablet Take 1 tablet by mouth every 8 (eight) hours as needed. n/v 06/17/14  Yes Historical Provider, MD  Vitamin D, Ergocalciferol, (DRISDOL) 50000 UNITS CAPS capsule Take 1 capsule (50,000 Units total) by mouth every 7 (seven) days. For 8 weeks 09/16/14  Yes Josalyn Funches, MD   BP 125/85 mmHg  Pulse 79  Resp 12  SpO2 100% Physical Exam  Constitutional: She is oriented to person, place, and time. She appears well-developed and well-nourished. No distress.  HENT:  Head: Normocephalic and atraumatic.  Eyes: EOM are normal.  Neck: Normal range of motion.  Cardiovascular: Normal rate, regular rhythm and normal heart sounds.   Pulmonary/Chest: Effort normal and breath sounds normal.  Abdominal: Soft. She exhibits no distension. There is no tenderness.  Musculoskeletal: Normal range of motion.  Neurological: She is alert and oriented to person, place, and time.  Skin: Skin is warm and dry.  Psychiatric: She has a normal mood and affect. Judgment normal.  Nursing note and vitals reviewed.   ED Course  Procedures (including critical care time) Labs Review Labs Reviewed  BASIC METABOLIC PANEL - Abnormal; Notable for the following:    Calcium 8.8 (*)    All other components within normal limits  CBC - Abnormal; Notable for the following:    Hemoglobin 11.4 (*)    All other components within normal limits  HEPATIC FUNCTION PANEL - Abnormal; Notable for the following:    Indirect Bilirubin 0.1 (*)    All other components within normal limits  LIPASE, BLOOD  I-STAT TROPOININ, ED    Imaging Review Dg Chest 2 View  11/19/2014   CLINICAL DATA:  Chest pain, pre cardiac catheterization  EXAM: CHEST  2 VIEW  COMPARISON:   08/14/2014  FINDINGS: The heart size and mediastinal contours are within normal limits. Both lungs are clear. The visualized skeletal structures are unremarkable.  IMPRESSION: No active cardiopulmonary disease.   Electronically Signed   By: Lahoma Crocker M.D.   On: 11/19/2014 09:40   I have personally reviewed and evaluated these images and lab results as part of my medical decision-making.   EKG Interpretation   Date/Time:  Wednesday November 19 2014 09:01:46 EDT Ventricular Rate:  79 PR Interval:  140 QRS Duration: 93 QT Interval:  364 QTC Calculation: 417 R Axis:   78 Text Interpretation:  Sinus rhythm Abnormal T,  consider ischemia, lateral  leads No significant change was found Confirmed by Jarius Dieudonne  MD, Kirstan Fentress  ([id number]) on 11/19/2014 9:20:26 AM      MDM   Final diagnoses:  Chest pain, unspecified chest pain type    Atypical chest pain with a nonischemic EKG and an EKG  that's unchanged from priors.  Her troponin is negative.  I spoke with cardiology we will likely with patient at bedside given that she is scheduled for left heart catheterization tomorrow and has an abnormal stress test.  This could represent acute coronary syndrome however my suspicion is somewhat low.  Aspirin now.  Pain control.  Cardiology consultation.    Jola Schmidt, MD 11/19/14 1055

## 2014-11-19 NOTE — Telephone Encounter (Signed)
New message      Pt c/o of Chest Pain: STAT if CP now or developed within 24 hours  1. Are you having CP right now? Yes----feels like an elephant is on her chest  2. Are you experiencing any other symptoms (ex. SOB, nausea, vomiting, sweating)? abd pain,  3. How long have you been experiencing CP? Couple of days  4. Is your CP continuous or coming and going?  5. Have you taken Nitroglycerin? yes ?

## 2014-11-19 NOTE — Telephone Encounter (Signed)
Incoming call, pt with ongoing intermittent chest pain x 2 days "electrical charge across my chest, face feels swollen, look like Im 9+ months pregnant"    Was awake at 3 am, took ntg x1, helpful but did not fully relieve pain and she has been awake ever since.    Also describes as chest pressure, "like elephant sitting on my chest".  Pt is scheduled for heart cath 11/20/14. Instructed her to go to ED for evaluation of her symptoms. I advised to call EMS if chest pain is active currently.  She states her daughter can drive her.    Informed Trish, Engineer, maintenance (IT).

## 2014-11-19 NOTE — ED Notes (Signed)
Pt ambulated to bathroom. Steady gait

## 2014-11-19 NOTE — H&P (Signed)
Patient ID: Candice Butler MRN: [id number], DOB/AGE: 14-Jun-1975   Admit date: 11/19/2014   Primary Physician: Minerva Ends, MD Primary Cardiologist: Dr. Stanford Breed  Pt. Profile:  39 year old Caucasian female was past medical history of palpitation, hyperlipidemia, tobacco abuse, polysubstance abuse, hypothyroidism and history of anxiety disorder presented with very atypical chest pain, causalgia has been consulted. Of note, patient was previously scheduled for outpatient cardiac catheterization on 11/20/2014 for abnormal Myoview.  Problem List  Past Medical History  Diagnosis Date  . Panic attacks Dx 2009  . Anxiety Dx 2009  . Depression Dx 2009  . Hernia of abdominal cavity Dx 2005  . Vitamin D deficiency   . Hypothyroid Dx 2015  . Stroke     per patient  . Seizure     per patient   . Hyperlipemia     per patient   . Asthma     Past Surgical History  Procedure Laterality Date  . Hernia repair    . Gastric bypass    . Abdominal hysterectomy    . Esophagogastroduodenoscopy    . Tonsillectomy       Allergies  Allergies  Allergen Reactions  . Bee Venom Anaphylaxis  . Ciprofloxacin Shortness Of Breath  . Ibuprofen Shortness Of Breath and Other (See Comments)    Throat closed up  . Antihistamines, Chlorpheniramine-Type Other (See Comments)    Throat closed up  . Boniva [Ibandronic Acid] Other (See Comments)    Hand swelling, muscles were sore  . Quetiapine Hives and Other (See Comments)    Hives, trouble breathing, throat closed up  . Sulfa Antibiotics Other (See Comments)    Throat closed up    HPI  The patient is a 39 year old Caucasian female was past medical history of palpitation, hyperlipidemia, tobacco abuse, polysubstance abuse, hypothyroidism and history of anxiety disorder. She was recently seen by Dr. Stanford Breed in the office on 10/30/2014 for atypical chest pain. She reportedly had a history of "holes in her heart" based on previous echocardiogram in  [place]. Her previous laboratory in June 2016 showed normal TSH, drug screen was positive for amphetamine and marijuana. She also has a significant family history of early CAD. Given her family history, echocardiogram and Myoview was obtained as outpatient. Echocardiogram on 06/12/2014 showed EF 55-60%, no regional wall motion abnormality, moderately dilated left atrium. Myoview showed EF 51%, reversible medium defect of moderate severity present in basal anterolateral, apical anterior and apex location, noted to have horizontal ST depression during stress along with T-wave inversion, finding consistent with ischemia. She was seen in the clinic on 11/14/2014 for update on Myoview and cardiology follow-up, at which time she continued to complain of intermittent chest discomfort along with back pain. She was set up to have outpatient cardiac catheterization on 11/20/2014 with Dr. Ellyn Hack.  Patient presented to [facility] ED on 9/7, one day prior to her scheduled outpatient cath with persistent chest pain for the last 2-3 days. Her chest pain continued to be atypical, worsen with palpation, sitting up, deep inspiration, body rotation, and ambulation. Also her chest pain has been ongoing for 2-3 days without stop. She also endorsed shortness of breath, diaphoresis and occasional dizziness. She also has significant upper back and lower back pain as well. On arrival to [facility] ED, CMP and CBC were normal. Troponin negative. Chest x-ray negative for acute process. EKG showed normal sinus rhythm with wandering baseline, however no significant ST-T wave changes. Cardiology has been consulted for chest  pain.   Home Medications  Prior to Admission medications   Medication Sig Start Date End Date Taking? Authorizing Provider  acetaminophen-codeine (TYLENOL #3) 300-30 MG per tablet Take 1 tablet by mouth every 8 (eight) hours as needed for moderate pain. Please fill on or after 11/14/2014 10/28/14  Yes Josalyn Funches, MD    albuterol (PROVENTIL HFA;VENTOLIN HFA) 108 (90 BASE) MCG/ACT inhaler Inhale 2 puffs into the lungs every 6 (six) hours as needed for wheezing or shortness of breath.   Yes Historical Provider, MD  amitriptyline (ELAVIL) 50 MG tablet Take 3 tablets by mouth daily. 07/21/14  Yes Historical Provider, MD  aspirin EC 81 MG tablet Take 1 tablet (81 mg total) by mouth daily. 11/14/14  Yes Scott Joylene Draft, PA-C  atorvastatin (LIPITOR) 40 MG tablet Take 1 tablet (40 mg total) by mouth daily. 09/16/14  Yes Boykin Nearing, MD  carisoprodol (SOMA) 350 MG tablet Take 1 tablet (350 mg total) by mouth 3 (three) times daily as needed for muscle spasms. 10/28/14  Yes Josalyn Funches, MD  Cholecalciferol (VITAMIN D3) 2000 UNITS TABS Take 2,000 Units by mouth daily. 09/16/14  Yes Josalyn Funches, MD  citalopram (CELEXA) 10 MG tablet Take 10 mg by mouth daily.  08/09/14  Yes Historical Provider, MD  folic acid (FOLVITE) 1 MG tablet Take 1 mg by mouth every evening.   Yes Historical Provider, MD  levothyroxine (SYNTHROID, LEVOTHROID) 75 MCG tablet Take 1 tablet (75 mcg total) by mouth daily. Patient taking differently: Take 75 mcg by mouth every evening.  09/10/14  Yes Josalyn Funches, MD  metoprolol succinate (TOPROL-XL) 25 MG 24 hr tablet Take 1 tablet (25 mg total) by mouth daily. 11/14/14  Yes Scott Joylene Draft, PA-C  Naphazoline HCl (CLEAR EYES OP) Place 1 drop into both eyes daily as needed (red or itchy eyes).   Yes Historical Provider, MD  NEXIUM 40 MG capsule Take 1 capsule (40 mg total) by mouth 2 (two) times daily. 09/16/14  Yes Josalyn Funches, MD  nitroGLYCERIN (NITROSTAT) 0.4 MG SL tablet Place 1 tablet (0.4 mg total) under the tongue every 5 (five) minutes as needed for chest pain. 11/14/14  Yes Scott T Kathlen Mody, PA-C  ondansetron (ZOFRAN-ODT) 8 MG disintegrating tablet Take 1 tablet by mouth every 8 (eight) hours as needed. n/v 06/17/14  Yes Historical Provider, MD  Vitamin D, Ergocalciferol, (DRISDOL) 50000 UNITS CAPS capsule  Take 1 capsule (50,000 Units total) by mouth every 7 (seven) days. For 8 weeks 09/16/14  Yes Boykin Nearing, MD    Family History  Family History  Problem Relation Age of Onset  . Heart failure Father   . Heart failure Sister   . Depression Mother   . Hyperlipidemia Father     Social History  Social History   Social History  . Marital Status: Single    Spouse Name: N/A  . Number of Children: 3  . Years of Education: N/A   Occupational History  . Not on file.   Social History Main Topics  . Smoking status: Current Every Day Smoker -- 1.00 packs/day    Types: Cigarettes  . Smokeless tobacco: Never Used  . Alcohol Use: 0.0 oz/week    0 Standard drinks or equivalent per week     Comment: occasionally  . Drug Use: Yes     Comment: marijuana  . Sexual Activity: Not on file   Other Topics Concern  . Not on file   Social History Narrative  Review of Systems General:  No chills, fever, night sweats or weight changes.  Cardiovascular:  No dyspnea on exertion, edema, orthopnea, paroxysmal nocturnal dyspnea. +chest pain, palpitation, back pain Dermatological: No rash, lesions/masses Respiratory: No cough, dyspnea Urologic: No hematuria, dysuria Abdominal:   No nausea, vomiting, diarrhea, bright red blood per rectum, melena, or hematemesis Neurologic:  No visual changes, wkns, changes in mental status. All other systems reviewed and are otherwise negative except as noted above.  Physical Exam  Blood pressure 98/66, pulse 72, temperature 98 F (36.7 C), temperature source Oral, resp. rate 17, SpO2 98 %.  General: Pleasant, NAD Psych: Normal affect. Neuro: Alert and oriented X 3. Moves all extremities spontaneously. HEENT: Normal  Neck: Supple without bruits or JVD. Lungs:  Resp regular and unlabored, CTA. Heart: RRR no s3, s4, or murmurs. Abdomen: Soft, non-tender, non-distended, BS + x 4.  Extremities: No clubbing, cyanosis or edema. DP/PT+ and equal bilaterally.  Weak radial pulses bilaterally  Labs  Troponin (Point of Care Test)  Recent Labs  11/19/14 0920  TROPIPOC 0.00   No results for input(s): CKTOTAL, CKMB, TROPONINI in the last 72 hours. Lab Results  Component Value Date   WBC 10.5 11/19/2014   HGB 11.4* 11/19/2014   HCT 36.2 11/19/2014   MCV 87.9 11/19/2014   PLT 389 11/19/2014    Recent Labs Lab 11/19/14 0915  NA 137  K 4.3  CL 102  CO2 27  BUN 19  CREATININE 0.76  CALCIUM 8.8*  PROT 7.5  BILITOT 0.3  ALKPHOS 116  ALT 26  AST 31  GLUCOSE 87   Lab Results  Component Value Date   CHOL 530* 09/10/2014   HDL 47 09/10/2014   LDLCALC 433* 09/10/2014   TRIG 249* 09/10/2014   No results found for: DDIMER   Radiology/Studies  Dg Chest 2 View  11/19/2014   CLINICAL DATA:  Chest pain, pre cardiac catheterization  EXAM: CHEST  2 VIEW  COMPARISON:  08/14/2014  FINDINGS: The heart size and mediastinal contours are within normal limits. Both lungs are clear. The visualized skeletal structures are unremarkable.  IMPRESSION: No active cardiopulmonary disease.   Electronically Signed   By: Lahoma Crocker M.D.   On: 11/19/2014 09:40   Dg Lumbar Spine 2-3 Views  10/21/2014   CLINICAL DATA:  Chronic low back pain  EXAM: LUMBAR SPINE - 2-3 VIEW  COMPARISON:  None.  FINDINGS: Five lumbar type vertebral bodies are well visualized. Irregularity of the superior anterior aspect of L3 is noted consistent with the patient's given clinical history of prior compression fracture. Mild aortic calcifications are noted. No anterolisthesis is seen. The overlying bowel gas pattern is within normal limits.  IMPRESSION: Chronic changes at L3.  No acute abnormality noted.   Electronically Signed   By: Inez Catalina M.D.   On: 10/21/2014 16:29    ECG  Normal sinus rhythm, wandering baseline, no significant ST-T wave changes.  Echocardiogram 11/12/2014  LV EF: 55% -  60%  ------------------------------------------------------------------- Indications:    Dyspnea (R06.00).  ------------------------------------------------------------------- History:  PMH: Palpitations, Asthma, Substance Abuse (Marijuana and Amphetamines), Anxiety. Chest pain. Stroke. Risk factors: Family history of coronary artery disease. Current tobacco use. Dyslipidemia.  ------------------------------------------------------------------- Study Conclusions  - Left ventricle: The cavity size was normal. Wall thickness was normal. Systolic function was normal. The estimated ejection fraction was in the range of 55% to 60%. Wall motion was normal; there were no regional wall motion abnormalities. Left ventricular diastolic function parameters were  normal. - Left atrium: The atrium was moderately dilated.    ASSESSMENT AND PLAN  1. Atypical chest pain, ongoing persistently for 2-3 days with negative EKG changes and troponin  - Originally planned for outpatient cardiac catheterization for tomorrow, will discuss was MD., admitt overnight for obs for cath tomorrow. Based on her presentation, her current chest pain is very unlikely to be related to her heart, however given his significant family history and comorbidities and abnormal myoview, will proceed with cath as scheduled.  - Echocardiogram on 06/12/2014 showed EF 55-60%, no regional wall motion abnormality, moderately dilated left atrium.  - Myoview showed EF 51%, reversible medium defect of moderate severity present in basal anterolateral, apical anterior and apex location, noted to have horizontal ST depression during stress along with T-wave inversion, finding consistent with ischemia  - Low suspicion for PE or dissection, systolic blood pressure 432 to 110s. Heart rate 70s.  2. Upper and lower back pain: per pt, lower back pain chronic, upper back pain new and feels separate from chest pain  3. Palpitation 4. Hyperlipidemia 5. tobacco abuse 6. H/o polysubstance abuse: last marijuana use 1 week  ago 7. hypothyroidism  8. history of anxiety disorder  Signed, Almyra Deforest, Hershal Coria 11/19/2014, 12:37 PM

## 2014-11-20 ENCOUNTER — Encounter (HOSPITAL_COMMUNITY)
Admission: EM | Disposition: A | Discharge status: Home / Self Care | Payer: Self-pay | Source: Home / Self Care | Attending: Interventional Cardiology

## 2014-11-20 ENCOUNTER — Ambulatory Visit (HOSPITAL_COMMUNITY): Admission: RE | Admit: 2014-11-20 | Payer: Medicare Other | Source: Ambulatory Visit | Admitting: Cardiology

## 2014-11-20 ENCOUNTER — Other Ambulatory Visit: Payer: Self-pay

## 2014-11-20 ENCOUNTER — Encounter (HOSPITAL_COMMUNITY): Payer: Self-pay | Admitting: Cardiology

## 2014-11-20 DIAGNOSIS — F411 Generalized anxiety disorder: Secondary | ICD-10-CM | POA: Diagnosis present

## 2014-11-20 DIAGNOSIS — F410 Panic disorder [episodic paroxysmal anxiety] without agoraphobia: Secondary | ICD-10-CM | POA: Diagnosis present

## 2014-11-20 DIAGNOSIS — G4700 Insomnia, unspecified: Secondary | ICD-10-CM | POA: Diagnosis present

## 2014-11-20 DIAGNOSIS — I25119 Atherosclerotic heart disease of native coronary artery with unspecified angina pectoris: Secondary | ICD-10-CM | POA: Diagnosis not present

## 2014-11-20 DIAGNOSIS — D62 Acute posthemorrhagic anemia: Secondary | ICD-10-CM | POA: Diagnosis not present

## 2014-11-20 DIAGNOSIS — Z01810 Encounter for preprocedural cardiovascular examination: Secondary | ICD-10-CM | POA: Diagnosis not present

## 2014-11-20 DIAGNOSIS — F17210 Nicotine dependence, cigarettes, uncomplicated: Secondary | ICD-10-CM | POA: Diagnosis present

## 2014-11-20 DIAGNOSIS — Z881 Allergy status to other antibiotic agents status: Secondary | ICD-10-CM | POA: Diagnosis not present

## 2014-11-20 DIAGNOSIS — I25118 Atherosclerotic heart disease of native coronary artery with other forms of angina pectoris: Secondary | ICD-10-CM | POA: Diagnosis not present

## 2014-11-20 DIAGNOSIS — F329 Major depressive disorder, single episode, unspecified: Secondary | ICD-10-CM | POA: Diagnosis present

## 2014-11-20 DIAGNOSIS — R030 Elevated blood-pressure reading, without diagnosis of hypertension: Secondary | ICD-10-CM | POA: Diagnosis not present

## 2014-11-20 DIAGNOSIS — I25110 Atherosclerotic heart disease of native coronary artery with unstable angina pectoris: Secondary | ICD-10-CM | POA: Diagnosis present

## 2014-11-20 DIAGNOSIS — R112 Nausea with vomiting, unspecified: Secondary | ICD-10-CM | POA: Diagnosis present

## 2014-11-20 DIAGNOSIS — I208 Other forms of angina pectoris: Secondary | ICD-10-CM | POA: Diagnosis not present

## 2014-11-20 DIAGNOSIS — Z91030 Bee allergy status: Secondary | ICD-10-CM | POA: Diagnosis not present

## 2014-11-20 DIAGNOSIS — K219 Gastro-esophageal reflux disease without esophagitis: Secondary | ICD-10-CM | POA: Diagnosis present

## 2014-11-20 DIAGNOSIS — E559 Vitamin D deficiency, unspecified: Secondary | ICD-10-CM | POA: Diagnosis present

## 2014-11-20 DIAGNOSIS — Z8249 Family history of ischemic heart disease and other diseases of the circulatory system: Secondary | ICD-10-CM | POA: Diagnosis not present

## 2014-11-20 DIAGNOSIS — F1210 Cannabis abuse, uncomplicated: Secondary | ICD-10-CM | POA: Diagnosis present

## 2014-11-20 DIAGNOSIS — M545 Low back pain: Secondary | ICD-10-CM | POA: Diagnosis present

## 2014-11-20 DIAGNOSIS — J45909 Unspecified asthma, uncomplicated: Secondary | ICD-10-CM | POA: Diagnosis present

## 2014-11-20 DIAGNOSIS — Z9884 Bariatric surgery status: Secondary | ICD-10-CM | POA: Diagnosis not present

## 2014-11-20 DIAGNOSIS — R0789 Other chest pain: Secondary | ICD-10-CM | POA: Diagnosis present

## 2014-11-20 DIAGNOSIS — J9811 Atelectasis: Secondary | ICD-10-CM | POA: Diagnosis not present

## 2014-11-20 DIAGNOSIS — F1120 Opioid dependence, uncomplicated: Secondary | ICD-10-CM | POA: Diagnosis present

## 2014-11-20 DIAGNOSIS — E8770 Fluid overload, unspecified: Secondary | ICD-10-CM | POA: Diagnosis not present

## 2014-11-20 DIAGNOSIS — I2582 Chronic total occlusion of coronary artery: Secondary | ICD-10-CM | POA: Diagnosis present

## 2014-11-20 DIAGNOSIS — B9620 Unspecified Escherichia coli [E. coli] as the cause of diseases classified elsewhere: Secondary | ICD-10-CM | POA: Diagnosis not present

## 2014-11-20 DIAGNOSIS — E669 Obesity, unspecified: Secondary | ICD-10-CM | POA: Diagnosis present

## 2014-11-20 DIAGNOSIS — G894 Chronic pain syndrome: Secondary | ICD-10-CM | POA: Diagnosis present

## 2014-11-20 DIAGNOSIS — Z888 Allergy status to other drugs, medicaments and biological substances status: Secondary | ICD-10-CM | POA: Diagnosis not present

## 2014-11-20 DIAGNOSIS — K5900 Constipation, unspecified: Secondary | ICD-10-CM | POA: Diagnosis not present

## 2014-11-20 DIAGNOSIS — Z8711 Personal history of peptic ulcer disease: Secondary | ICD-10-CM | POA: Diagnosis not present

## 2014-11-20 DIAGNOSIS — Z882 Allergy status to sulfonamides status: Secondary | ICD-10-CM | POA: Diagnosis not present

## 2014-11-20 DIAGNOSIS — Z6839 Body mass index (BMI) 39.0-39.9, adult: Secondary | ICD-10-CM | POA: Diagnosis not present

## 2014-11-20 DIAGNOSIS — R1084 Generalized abdominal pain: Secondary | ICD-10-CM | POA: Diagnosis not present

## 2014-11-20 DIAGNOSIS — R072 Precordial pain: Secondary | ICD-10-CM | POA: Diagnosis not present

## 2014-11-20 DIAGNOSIS — Z720 Tobacco use: Secondary | ICD-10-CM | POA: Diagnosis not present

## 2014-11-20 DIAGNOSIS — R002 Palpitations: Secondary | ICD-10-CM | POA: Diagnosis present

## 2014-11-20 DIAGNOSIS — Z7982 Long term (current) use of aspirin: Secondary | ICD-10-CM | POA: Diagnosis not present

## 2014-11-20 DIAGNOSIS — E785 Hyperlipidemia, unspecified: Secondary | ICD-10-CM | POA: Diagnosis present

## 2014-11-20 DIAGNOSIS — G4730 Sleep apnea, unspecified: Secondary | ICD-10-CM | POA: Diagnosis present

## 2014-11-20 DIAGNOSIS — D649 Anemia, unspecified: Secondary | ICD-10-CM | POA: Diagnosis present

## 2014-11-20 DIAGNOSIS — N390 Urinary tract infection, site not specified: Secondary | ICD-10-CM | POA: Diagnosis not present

## 2014-11-20 DIAGNOSIS — E039 Hypothyroidism, unspecified: Secondary | ICD-10-CM | POA: Diagnosis present

## 2014-11-20 HISTORY — PX: CARDIAC CATHETERIZATION: SHX172

## 2014-11-20 HISTORY — DX: Atherosclerotic heart disease of native coronary artery with unspecified angina pectoris: I25.119

## 2014-11-20 LAB — BASIC METABOLIC PANEL
Anion gap: 7 (ref 5–15)
BUN: 13 mg/dL (ref 6–20)
CHLORIDE: 103 mmol/L (ref 101–111)
CO2: 26 mmol/L (ref 22–32)
Calcium: 8.5 mg/dL — ABNORMAL LOW (ref 8.9–10.3)
Creatinine, Ser: 0.8 mg/dL (ref 0.44–1.00)
GFR calc non Af Amer: >60 mL/min (ref >60)
Glucose, Bld: 98 mg/dL (ref 65–99)
POTASSIUM: 4.4 mmol/L (ref 3.5–5.1)
SODIUM: 136 mmol/L (ref 135–145)

## 2014-11-20 LAB — TROPONIN I: Troponin I: <0.03 ng/mL (ref <0.031)

## 2014-11-20 LAB — POCT ACTIVATED CLOTTING TIME
ACTIVATED CLOTTING TIME: 227 s
ACTIVATED CLOTTING TIME: 251 s
Activated Clotting Time: 183 seconds

## 2014-11-20 SURGERY — LEFT HEART CATH AND CORONARY ANGIOGRAPHY
Anesthesia: LOCAL

## 2014-11-20 MED ORDER — FENTANYL CITRATE (PF) 100 MCG/2ML IJ SOLN
INTRAMUSCULAR | Status: DC | PRN
  Administered 2014-11-20: 50 ug via INTRAVENOUS
  Administered 2014-11-20: 25 ug via INTRAVENOUS

## 2014-11-20 MED ORDER — ZOLPIDEM TARTRATE 5 MG PO TABS
5.0000 mg | ORAL_TABLET | Freq: Once | ORAL | Status: AC
  Administered 2014-11-20: 5 mg via ORAL
  Filled 2014-11-20: qty 1

## 2014-11-20 MED ORDER — SODIUM CHLORIDE 0.9 % IJ SOLN
3.0000 mL | Freq: Two times a day (BID) | INTRAMUSCULAR | Status: DC
  Administered 2014-11-20 – 2014-11-25 (×10): 3 mL via INTRAVENOUS

## 2014-11-20 MED ORDER — HEPARIN SODIUM (PORCINE) 1000 UNIT/ML IJ SOLN: INTRAMUSCULAR | Status: DC | PRN

## 2014-11-20 MED ORDER — FENTANYL CITRATE (PF) 100 MCG/2ML IJ SOLN
INTRAMUSCULAR | Status: AC
  Filled 2014-11-20: qty 4

## 2014-11-20 MED ORDER — NITROGLYCERIN 1 MG/10 ML FOR IR/CATH LAB
INTRA_ARTERIAL | Status: DC | PRN
  Administered 2014-11-20: 13:00:00

## 2014-11-20 MED ORDER — CLOPIDOGREL BISULFATE 75 MG PO TABS: 300.0000 mg | ORAL_TABLET | Freq: Once | ORAL | Status: DC

## 2014-11-20 MED ORDER — HEPARIN (PORCINE) IN NACL 2-0.9 UNIT/ML-% IJ SOLN
INTRAMUSCULAR | Status: AC
  Filled 2014-11-20: qty 1000

## 2014-11-20 MED ORDER — HEPARIN SODIUM (PORCINE) 1000 UNIT/ML IJ SOLN
INTRAMUSCULAR | Status: AC
  Filled 2014-11-20: qty 1

## 2014-11-20 MED ORDER — VERAPAMIL HCL 2.5 MG/ML IV SOLN
INTRAVENOUS | Status: DC | PRN
  Administered 2014-11-20: 12:00:00 via INTRA_ARTERIAL

## 2014-11-20 MED ORDER — LIDOCAINE HCL (PF) 1 % IJ SOLN
INTRAMUSCULAR | Status: AC
  Filled 2014-11-20: qty 30

## 2014-11-20 MED ORDER — NITROGLYCERIN 1 MG/10 ML FOR IR/CATH LAB
INTRA_ARTERIAL | Status: AC
  Filled 2014-11-20: qty 10

## 2014-11-20 MED ORDER — IOHEXOL 350 MG/ML SOLN
INTRAVENOUS | Status: DC | PRN
  Administered 2014-11-20: 130 mL via INTRA_ARTERIAL

## 2014-11-20 MED ORDER — CLOPIDOGREL BISULFATE 75 MG PO TABS: 75.0000 mg | ORAL_TABLET | Freq: Every day | ORAL | Status: DC

## 2014-11-20 MED ORDER — MIDAZOLAM HCL 2 MG/2ML IJ SOLN
INTRAMUSCULAR | Status: AC
  Filled 2014-11-20: qty 4

## 2014-11-20 MED ORDER — NICOTINE POLACRILEX 2 MG MT GUM
4.0000 mg | CHEWING_GUM | OROMUCOSAL | Status: DC | PRN
  Filled 2014-11-20: qty 2

## 2014-11-20 MED ORDER — HEPARIN SODIUM (PORCINE) 1000 UNIT/ML IJ SOLN
INTRAMUSCULAR | Status: DC | PRN
  Administered 2014-11-20: 2000 [IU] via INTRAVENOUS
  Administered 2014-11-20: 5000 [IU] via INTRAVENOUS
  Administered 2014-11-20: 3000 [IU]

## 2014-11-20 MED ORDER — NICOTINE POLACRILEX 2 MG MT GUM
4.0000 mg | CHEWING_GUM | OROMUCOSAL | Status: DC | PRN
  Administered 2014-11-24 – 2014-11-25 (×4): 4 mg via ORAL
  Filled 2014-11-20 (×6): qty 2

## 2014-11-20 MED ORDER — ESOMEPRAZOLE MAGNESIUM 40 MG PO CPDR
80.0000 mg | DELAYED_RELEASE_CAPSULE | Freq: Every day | ORAL | Status: DC
  Administered 2014-11-20 – 2014-11-25 (×6): 80 mg via ORAL
  Filled 2014-11-20 (×7): qty 2

## 2014-11-20 MED ORDER — SODIUM CHLORIDE 0.9 % IV SOLN: 250.0000 mL | INTRAVENOUS | Status: DC | PRN

## 2014-11-20 MED ORDER — SODIUM CHLORIDE 0.9 % WEIGHT BASED INFUSION
3.0000 mL/kg/h | INTRAVENOUS | Status: AC
  Administered 2014-11-20: 3 mL/kg/h via INTRAVENOUS

## 2014-11-20 MED ORDER — SODIUM CHLORIDE 0.9 % IJ SOLN: 3.0000 mL | INTRAMUSCULAR | Status: DC | PRN

## 2014-11-20 MED ORDER — MIDAZOLAM HCL 2 MG/2ML IJ SOLN
INTRAMUSCULAR | Status: DC | PRN
  Administered 2014-11-20: 2 mg via INTRAVENOUS
  Administered 2014-11-20: 1 mg via INTRAVENOUS

## 2014-11-20 SURGICAL SUPPLY — 15 items
CATH INFINITI 5FR ANG PIGTAIL (CATHETERS) ×3 IMPLANT
CATH INFINITI JR4 5F (CATHETERS) ×3 IMPLANT
CATH OPTITORQUE TIG 4.0 5F (CATHETERS) ×3 IMPLANT
DEVICE RAD COMP TR BAND LRG (VASCULAR PRODUCTS) ×3 IMPLANT
GLIDESHEATH SLEND A-KIT 6F 22G (SHEATH) ×3 IMPLANT
GUIDE CATH RUNWAY 6FR CLS4 (CATHETERS) ×3 IMPLANT
KIT ENCORE 26 ADVANTAGE (KITS) ×3 IMPLANT
KIT HEART LEFT (KITS) ×3 IMPLANT
PACK CARDIAC CATHETERIZATION (CUSTOM PROCEDURE TRAY) ×3 IMPLANT
TRANSDUCER W/STOPCOCK (MISCELLANEOUS) ×3 IMPLANT
TUBING CIL FLEX 10 FLL-RA (TUBING) ×3 IMPLANT
WIRE HI TORQ VERSACORE-J 145CM (WIRE) ×3 IMPLANT
WIRE INTUITION PROPEL ST 180CM (WIRE) ×3 IMPLANT
WIRE PT2 LS 185 (WIRE) ×3 IMPLANT
WIRE SAFE-T 1.5MM-J .035X260CM (WIRE) ×3 IMPLANT

## 2014-11-20 NOTE — Progress Notes (Addendum)
Patient is requesting Ambien, says she normally takes this at home for sleep and has not slept well since shes been here. Paged Fellow Ameer on call and awaiting any new orders.   Updated Dr. Vonda Antigua that patient is still complaining of chest pain and had questions about possibly getting another heart cath tomorrow for stent placement. Per Dr. Vonda Antigua plan for tomorrow is for surgeon to see patient. Will continue to treat pain with ordered pain medications.

## 2014-11-20 NOTE — Interval H&P Note (Signed)
History and Physical Interval Note:  11/20/2014 11:51 AM  Candice Butler  has presented today for surgery, with the diagnosis of abnormal stress test ordered for Atypical Anginal symptoms.  The various methods of treatment have been discussed with the patient and family. After consideration of risks, benefits and other options for treatment, the patient has consented to  Procedure(s): Left Heart Cath and Coronary Angiography (N/A) with possible Percutaneous Coronary Intervention as a surgical intervention .  The patient's history has been reviewed, patient examined, no change in status, stable for surgery.  I have reviewed the patient's chart and labs.  Questions were answered to the patient's satisfaction.    Cath Lab Visit (complete for each Cath Lab visit)  Clinical Evaluation Leading to the Procedure:   ACS: No.  Non-ACS:    Anginal Classification: CCS III  Anti-ischemic medical therapy: Minimal Therapy (1 class of medications)  Non-Invasive Test Results: Intermediate-risk stress test findings: cardiac mortality 1-3%/year  Prior CABG: No previous CABG   AUC  Ischemic Symptoms? CCS III (Marked limitation of ordinary activity) Anti-ischemic Medical Therapy? Minimal Therapy (1 class of medications) Non-invasive Test Results? Intermediate-risk stress test findings: cardiac mortality 1-3%/year Prior CABG? No Previous CABG   Patient Information:   1-2V CAD, no prox LAD  U (6)  Indication: 16; Score: 6   Patient Information:   CTO of 1 vessel, no other CAD  U (6)  Indication: 26; Score: 6   Patient Information:   1V CAD with prox LAD  A (7)  Indication: 32; Score: 7   Patient Information:   2V-CAD with prox LAD  A (8)  Indication: 38; Score: 8   Patient Information:   3V-CAD without LMCA  A (8)  Indication: 44; Score: 8   Patient Information:   3V-CAD without LMCA With Abnormal LV systolic function  A (9)  Indication: 48; Score: 9   Patient  Information:   LMCA-CAD  A (9)  Indication: 49; Score: 9   Patient Information:   2V-CAD with prox LAD PCI  A (7)  Indication: 62; Score: 7   Patient Information:   2V-CAD with prox LAD CABG  A (8)  Indication: 62; Score: 8   Patient Information:   3V-CAD without LMCA With Low CAD burden(i.e., 3 focal stenoses, low SYNTAX score) PCI  A (7)  Indication: 63; Score: 7   Patient Information:   3V-CAD without LMCA With Low CAD burden(i.e., 3 focal stenoses, low SYNTAX score) CABG  A (9)  Indication: 63; Score: 9   Patient Information:   3V-CAD without LMCA E06c - Intermediate-high CAD burden (i.e., multiple diffuse lesions, presence of CTO, or high SYNTAX score) PCI  U (4)  Indication: 64; Score: 4   Patient Information:   3V-CAD without LMCA E06c - Intermediate-high CAD burden (i.e., multiple diffuse lesions, presence of CTO, or high SYNTAX score) CABG  A (9)  Indication: 64; Score: 9   Patient Information:   LMCA-CAD With Isolated LMCA stenosis  PCI  U (6)  Indication: 65; Score: 6   Patient Information:   LMCA-CAD Additional CAD, low CAD burden (i.e., 1- to 2-vessel additional involvement, low SYNTAX score) PCI  U (5)  Indication: 66; Score: 5   Patient Information:   LMCA-CAD Additional CAD, low CAD burden (i.e., 1- to 2-vessel additional involvement, low SYNTAX score) CABG  A (9)  Indication: 66; Score: 9   Patient Information:   LMCA-CAD With Isolated LMCA stenosis  CABG  A (9)  Indication:  66; Score: 9   Patient Information:   LMCA-CAD Additional CAD, intermediate-high CAD burden (i.e., 3-vessel involvement, presence of CTO, or high SYNTAX score) PCI  I (3)  Indication: 67; Score: 3   Patient Information:   LMCA-CAD Additional CAD, intermediate-high CAD burden (i.e., 3-vessel involvement, presence of CTO, or high SYNTAX score) CABG  A (9)  Indication: 67; Score: 9   Candice Butler

## 2014-11-20 NOTE — Progress Notes (Signed)
Patient complaining of chest pain "hurts worse when I touch my chest." Received Tylenol 3 and earlier for same pain and says it did not help. Paged Dr. Claiborne Billings to about this. Received order for Norco.

## 2014-11-20 NOTE — H&P (View-Only) (Signed)
Patient Name: Candice Butler Date of Encounter: 11/20/2014  Active Problems:   HLD (hyperlipidemia)   Anxiety state   Atypical chest pain   Abnormal nuclear stress test   Atypical angina  SUBJECTIVE  Complaining for chest discomfort underneath xiphoid process. Pain when taking deep breath. No sob. Cath today.   CURRENT MEDS . amitriptyline  150 mg Oral Daily  . aspirin EC  81 mg Oral Daily  . atorvastatin  40 mg Oral Daily  . cholecalciferol  2,000 Units Oral Daily  . citalopram  10 mg Oral Daily  . esomeprazole  80 mg Oral Daily  . folic acid  1 mg Oral QPM  . heparin  5,000 Units Subcutaneous 3 times per day  . levothyroxine  75 mcg Oral QPM  . metoprolol succinate  25 mg Oral Daily  . sodium chloride  3 mL Intravenous Q12H    OBJECTIVE  Filed Vitals:   11/19/14 1900 11/19/14 2053 11/19/14 2200 11/20/14 0546  BP:  105/65  102/67  Pulse: 76 75 75 64  Temp:  98.2 F (36.8 C)  97.9 F (36.6 C)  TempSrc:  Oral  Oral  Resp:  16  16  Height:    5\' 7"  (1.702 m)  Weight:    243 lb 9.6 oz (110.496 kg)  SpO2: 96% 95% 97% 98%   No intake or output data in the 24 hours ending 11/20/14 0858 Filed Weights   11/20/14 0546  Weight: 243 lb 9.6 oz (110.496 kg)    PHYSICAL EXAM  General: Pleasant, NAD. Neuro: Alert and oriented X 3. Moves all extremities spontaneously. Psych: Normal affect. HEENT:  Normal  Neck: Supple without bruits or JVD. Lungs:  Resp regular and unlabored, CTA. Tender to palpation underneath xiphoid process. Heart: RRR no s3, s4, or murmurs. Abdomen: Soft, non-tender, non-distended, BS + x 4.  Extremities: No clubbing, cyanosis or edema. Radials 1+ and equal bilaterally.  Accessory Clinical Findings  CBC  Recent Labs  11/19/14 0915 11/19/14 1745  WBC 10.5 10.3  HGB 11.4* 10.7*  HCT 36.2 34.7*  MCV 87.9 86.3  PLT 389 009*   Basic Metabolic Panel  Recent Labs  11/19/14 0915 11/19/14 1745 11/20/14 0510  NA 137  --  136  K 4.3  --  4.4   CL 102  --  103  CO2 27  --  26  GLUCOSE 87  --  98  BUN 19  --  13  CREATININE 0.76 0.78 0.80  CALCIUM 8.8*  --  8.5*   Liver Function Tests  Recent Labs  11/19/14 0915  AST 31  ALT 26  ALKPHOS 116  BILITOT 0.3  PROT 7.5  ALBUMIN 3.5    Recent Labs  11/19/14 0915  LIPASE 24   Cardiac Enzymes  Recent Labs  11/19/14 1745 11/19/14 2132 11/20/14 0510  TROPONINI <0.03 <0.03 <0.03    TELE  NSR at rate of 60s.  Radiology/Studies  Dg Chest 2 View  11/19/2014   CLINICAL DATA:  Chest pain, pre cardiac catheterization  EXAM: CHEST  2 VIEW  COMPARISON:  08/14/2014  FINDINGS: The heart size and mediastinal contours are within normal limits. Both lungs are clear. The visualized skeletal structures are unremarkable.  IMPRESSION: No active cardiopulmonary disease.   Electronically Signed   By: Lahoma Crocker M.D.   On: 11/19/2014 09:40   Dg Lumbar Spine 2-3 Views  10/21/2014   CLINICAL DATA:  Chronic low back pain  EXAM: LUMBAR SPINE -  2-3 VIEW  COMPARISON:  None.  FINDINGS: Five lumbar type vertebral bodies are well visualized. Irregularity of the superior anterior aspect of L3 is noted consistent with the patient's given clinical history of prior compression fracture. Mild aortic calcifications are noted. No anterolisthesis is seen. The overlying bowel gas pattern is within normal limits.  IMPRESSION: Chronic changes at L3.  No acute abnormality noted.   Electronically Signed   By: Inez Catalina M.D.   On: 10/21/2014 16:29    ASSESSMENT AND PLAN   1. Atypical chest pain, ongoing persistently for 2-3 days with negative EKG changes and troponin - Likely MSKwith costochondritis. Pain is reproducible with palpation and pain exacerbated by deep breath. Given 1 dose of IV decadron. - Consider adding NSAIDs post cath.  -  Outpatient planned cath today. Top x 3 negative. EKG without acute abnormality.  - Echocardiogram on 06/12/2014 showed EF 55-60%, no regional wall motion abnormality,  moderately dilated left atrium.  - Myoview showed EF 51%, reversible medium defect of moderate severity present in basal anterolateral, apical anterior and apex location, noted to have horizontal ST depression during stress along with T-wave inversion, finding consistent with ischemia - Continue ASA, statin and BB   2. Upper and lower back pain: per pt, lower back pain chronic, upper back pain new and feels separate from chest pain  3. Palpitation 4. Hyperlipidemia 5. tobacco abuse: advice smoking cessation 6. H/o polysubstance abuse: last marijuana use 1 week ago - advise abstinence 7. hypothyroidism  8. history of anxiety disorder  Signed, Bhagat,Bhavinkumar PA-C Pager [phone]  As above, patient seen and examined. She continues to have chest pressure that increases with inspiration. There is some reproduction with palpation. Enzymes negative. However recent nuclear study abnormal. Plan cardiac catheterization. The risks and benefits were discussed and she agrees to proceed. Continue Toprol for history of palpitations. Patient can be discharged if catheterization reveals no coronary disease. Kirk Ruths

## 2014-11-20 NOTE — Progress Notes (Signed)
Patient Name: Candice Butler Date of Encounter: 11/20/2014  Active Problems:   HLD (hyperlipidemia)   Anxiety state   Atypical chest pain   Abnormal nuclear stress test   Atypical angina  SUBJECTIVE  Complaining for chest discomfort underneath xiphoid process. Pain when taking deep breath. No sob. Cath today.   CURRENT MEDS . amitriptyline  150 mg Oral Daily  . aspirin EC  81 mg Oral Daily  . atorvastatin  40 mg Oral Daily  . cholecalciferol  2,000 Units Oral Daily  . citalopram  10 mg Oral Daily  . esomeprazole  80 mg Oral Daily  . folic acid  1 mg Oral QPM  . heparin  5,000 Units Subcutaneous 3 times per day  . levothyroxine  75 mcg Oral QPM  . metoprolol succinate  25 mg Oral Daily  . sodium chloride  3 mL Intravenous Q12H    OBJECTIVE  Filed Vitals:   11/19/14 1900 11/19/14 2053 11/19/14 2200 11/20/14 0546  BP:  105/65  102/67  Pulse: 76 75 75 64  Temp:  98.2 F (36.8 C)  97.9 F (36.6 C)  TempSrc:  Oral  Oral  Resp:  16  16  Height:    5\' 7"  (1.702 m)  Weight:    243 lb 9.6 oz (110.496 kg)  SpO2: 96% 95% 97% 98%   No intake or output data in the 24 hours ending 11/20/14 0858 Filed Weights   11/20/14 0546  Weight: 243 lb 9.6 oz (110.496 kg)    PHYSICAL EXAM  General: Pleasant, NAD. Neuro: Alert and oriented X 3. Moves all extremities spontaneously. Psych: Normal affect. HEENT:  Normal  Neck: Supple without bruits or JVD. Lungs:  Resp regular and unlabored, CTA. Tender to palpation underneath xiphoid process. Heart: RRR no s3, s4, or murmurs. Abdomen: Soft, non-tender, non-distended, BS + x 4.  Extremities: No clubbing, cyanosis or edema. Radials 1+ and equal bilaterally.  Accessory Clinical Findings  CBC  Recent Labs  11/19/14 0915 11/19/14 1745  WBC 10.5 10.3  HGB 11.4* 10.7*  HCT 36.2 34.7*  MCV 87.9 86.3  PLT 389 701*   Basic Metabolic Panel  Recent Labs  11/19/14 0915 11/19/14 1745 11/20/14 0510  NA 137  --  136  K 4.3  --  4.4   CL 102  --  103  CO2 27  --  26  GLUCOSE 87  --  98  BUN 19  --  13  CREATININE 0.76 0.78 0.80  CALCIUM 8.8*  --  8.5*   Liver Function Tests  Recent Labs  11/19/14 0915  AST 31  ALT 26  ALKPHOS 116  BILITOT 0.3  PROT 7.5  ALBUMIN 3.5    Recent Labs  11/19/14 0915  LIPASE 24   Cardiac Enzymes  Recent Labs  11/19/14 1745 11/19/14 2132 11/20/14 0510  TROPONINI <0.03 <0.03 <0.03    TELE  NSR at rate of 60s.  Radiology/Studies  Dg Chest 2 View  11/19/2014   CLINICAL DATA:  Chest pain, pre cardiac catheterization  EXAM: CHEST  2 VIEW  COMPARISON:  08/14/2014  FINDINGS: The heart size and mediastinal contours are within normal limits. Both lungs are clear. The visualized skeletal structures are unremarkable.  IMPRESSION: No active cardiopulmonary disease.   Electronically Signed   By: Lahoma Crocker M.D.   On: 11/19/2014 09:40   Dg Lumbar Spine 2-3 Views  10/21/2014   CLINICAL DATA:  Chronic low back pain  EXAM: LUMBAR SPINE -  2-3 VIEW  COMPARISON:  None.  FINDINGS: Five lumbar type vertebral bodies are well visualized. Irregularity of the superior anterior aspect of L3 is noted consistent with the patient's given clinical history of prior compression fracture. Mild aortic calcifications are noted. No anterolisthesis is seen. The overlying bowel gas pattern is within normal limits.  IMPRESSION: Chronic changes at L3.  No acute abnormality noted.   Electronically Signed   By: Inez Catalina M.D.   On: 10/21/2014 16:29    ASSESSMENT AND PLAN   1. Atypical chest pain, ongoing persistently for 2-3 days with negative EKG changes and troponin - Likely MSKwith costochondritis. Pain is reproducible with palpation and pain exacerbated by deep breath. Given 1 dose of IV decadron. - Consider adding NSAIDs post cath.  -  Outpatient planned cath today. Top x 3 negative. EKG without acute abnormality.  - Echocardiogram on 06/12/2014 showed EF 55-60%, no regional wall motion abnormality,  moderately dilated left atrium.  - Myoview showed EF 51%, reversible medium defect of moderate severity present in basal anterolateral, apical anterior and apex location, noted to have horizontal ST depression during stress along with T-wave inversion, finding consistent with ischemia - Continue ASA, statin and BB   2. Upper and lower back pain: per pt, lower back pain chronic, upper back pain new and feels separate from chest pain  3. Palpitation 4. Hyperlipidemia 5. tobacco abuse: advice smoking cessation 6. H/o polysubstance abuse: last marijuana use 1 week ago - advise abstinence 7. hypothyroidism  8. history of anxiety disorder  Signed, Bhagat,Bhavinkumar PA-C Pager [phone]  As above, patient seen and examined. She continues to have chest pressure that increases with inspiration. There is some reproduction with palpation. Enzymes negative. However recent nuclear study abnormal. Plan cardiac catheterization. The risks and benefits were discussed and she agrees to proceed. Continue Toprol for history of palpitations. Patient can be discharged if catheterization reveals no coronary disease. Kirk Ruths

## 2014-11-21 ENCOUNTER — Other Ambulatory Visit: Payer: Self-pay | Admitting: *Deleted

## 2014-11-21 ENCOUNTER — Encounter (HOSPITAL_COMMUNITY): Payer: Self-pay | Admitting: Thoracic Surgery (Cardiothoracic Vascular Surgery)

## 2014-11-21 DIAGNOSIS — I25110 Atherosclerotic heart disease of native coronary artery with unstable angina pectoris: Secondary | ICD-10-CM

## 2014-11-21 DIAGNOSIS — I208 Other forms of angina pectoris: Secondary | ICD-10-CM

## 2014-11-21 DIAGNOSIS — Z720 Tobacco use: Secondary | ICD-10-CM | POA: Diagnosis present

## 2014-11-21 DIAGNOSIS — I2510 Atherosclerotic heart disease of native coronary artery without angina pectoris: Secondary | ICD-10-CM

## 2014-11-21 LAB — BASIC METABOLIC PANEL
Anion gap: 8 (ref 5–15)
BUN: 11 mg/dL (ref 6–20)
CHLORIDE: 99 mmol/L — AB (ref 101–111)
CO2: 29 mmol/L (ref 22–32)
Calcium: 8.9 mg/dL (ref 8.9–10.3)
Creatinine, Ser: 0.93 mg/dL (ref 0.44–1.00)
GFR calc Af Amer: >60 mL/min (ref >60)
GFR calc non Af Amer: >60 mL/min (ref >60)
GLUCOSE: 114 mg/dL — AB (ref 65–99)
POTASSIUM: 4.6 mmol/L (ref 3.5–5.1)
Sodium: 136 mmol/L (ref 135–145)

## 2014-11-21 MED ORDER — ZOLPIDEM TARTRATE 5 MG PO TABS
5.0000 mg | ORAL_TABLET | Freq: Every evening | ORAL | Status: DC | PRN
  Administered 2014-11-22: 5 mg via ORAL
  Filled 2014-11-21: qty 1

## 2014-11-21 MED ORDER — PROMETHAZINE HCL 25 MG/ML IJ SOLN
12.5000 mg | Freq: Once | INTRAMUSCULAR | Status: DC
  Filled 2014-11-21: qty 1

## 2014-11-21 MED ORDER — PROMETHAZINE HCL 25 MG/ML IJ SOLN
12.5000 mg | Freq: Once | INTRAMUSCULAR | Status: AC
  Administered 2014-11-21: 12.5 mg via INTRAVENOUS

## 2014-11-21 MED ORDER — ACETAMINOPHEN-CODEINE #3 300-30 MG PO TABS
1.0000 | ORAL_TABLET | Freq: Once | ORAL | Status: AC
  Administered 2014-11-21: 1 via ORAL
  Filled 2014-11-21: qty 1

## 2014-11-21 NOTE — Progress Notes (Signed)
Patient Name: Candice Butler Date of Encounter: 11/21/2014  Active Problems:   HLD (hyperlipidemia)   Anxiety state   Atypical chest pain   Abnormal nuclear stress test   Atypical angina  SUBJECTIVE  Still complaining for chest discomfort  Pain when taking deep breath. No sob. Has vomited this morning. (getting pain meds and zofran frequently).  CURRENT MEDS . amitriptyline  150 mg Oral Daily  . aspirin EC  81 mg Oral Daily  . atorvastatin  40 mg Oral Daily  . cholecalciferol  2,000 Units Oral Daily  . citalopram  10 mg Oral Daily  . esomeprazole  80 mg Oral Daily  . folic acid  1 mg Oral QPM  . heparin  5,000 Units Subcutaneous 3 times per day  . levothyroxine  75 mcg Oral QPM  . metoprolol succinate  25 mg Oral Daily  . sodium chloride  3 mL Intravenous Q12H    OBJECTIVE  Filed Vitals:   11/20/14 2056 11/20/14 2140 11/20/14 2300 11/21/14 0548  BP:  99/57 115/86 110/81  Pulse:  65  64  Temp: 97.9 F (36.6 C)  97.7 F (36.5 C) 98 F (36.7 C)  TempSrc: Oral  Oral Oral  Resp:  18 16 17   Height:      Weight:    249 lb 9.6 oz (113.218 kg)  SpO2:  92% 97% 97%    Intake/Output Summary (Last 24 hours) at 11/21/14 0833 Last data filed at 11/21/14 0500  Gross per 24 hour  Intake 1865.7 ml  Output   2100 ml  Net -234.3 ml   Filed Weights   11/20/14 0546 11/21/14 0548  Weight: 243 lb 9.6 oz (110.496 kg) 249 lb 9.6 oz (113.218 kg)    PHYSICAL EXAM  General: Pleasant, NAD. Neuro: Alert and oriented X 3. Moves all extremities spontaneously. Psych: Normal affect. HEENT:  Normal  Neck: Supple without bruits or JVD. Lungs:  Resp regular and unlabored, CTA. Tender to palpation underneath xiphoid process. Heart: RRR no s3, s4, or murmurs. Abdomen: Soft, non-tender, non-distended, BS + x 4.  Extremities: No clubbing, cyanosis or edema. DP 1+ and equal bilaterally. Right radial cath site without hematoma or erythema.   Accessory Clinical Findings  CBC  Recent Labs  11/19/14 0915 11/19/14 1745  WBC 10.5 10.3  HGB 11.4* 10.7*  HCT 36.2 34.7*  MCV 87.9 86.3  PLT 389 175*   Basic Metabolic Panel  Recent Labs  11/19/14 0915 11/19/14 1745 11/20/14 0510  NA 137  --  136  K 4.3  --  4.4  CL 102  --  103  CO2 27  --  26  GLUCOSE 87  --  98  BUN 19  --  13  CREATININE 0.76 0.78 0.80  CALCIUM 8.8*  --  8.5*   Liver Function Tests  Recent Labs  11/19/14 0915  AST 31  ALT 26  ALKPHOS 116  BILITOT 0.3  PROT 7.5  ALBUMIN 3.5    Recent Labs  11/19/14 0915  LIPASE 24   Cardiac Enzymes  Recent Labs  11/19/14 1745 11/19/14 2132 11/20/14 0510  TROPONINI <0.03 <0.03 <0.03    TELE  NSR at rate of 60s.  Radiology/Studies  Dg Chest 2 View  11/19/2014   CLINICAL DATA:  Chest pain, pre cardiac catheterization  EXAM: CHEST  2 VIEW  COMPARISON:  08/14/2014  FINDINGS: The heart size and mediastinal contours are within normal limits. Both lungs are clear. The visualized skeletal structures are  unremarkable.  IMPRESSION: No active cardiopulmonary disease.   Electronically Signed   By: Lahoma Crocker M.D.   On: 11/19/2014 09:40    ASSESSMENT AND PLAN   1. Atypical chest pain, ongoing persistently for 2-3 days with negative EKG changes and troponin - Cath showed severe Multivessel CAD with 2 chronically occluded arteries. Case discussed with Dr. Martinique for possible CTO PCI. Will get CT Surgery consult for further recommendations.  - Echocardiogram on 06/12/2014 showed EF 55-60%, no regional wall motion abnormality, moderately dilated left atrium.  - Continue ASA, statin and BB. Patient having constant chest pain. Consider adding nitro past.    2. Upper and lower back pain: per pt, lower back pain chronic, upper back pain new and feels separate from chest pain 3. Palpitation: Continue troprol 4. Hyperlipidemia: Continue statin 5. tobacco abuse: advice smoking cessation 6. H/o polysubstance abuse: last marijuana use 1 week ago - advise  abstinence 7. hypothyroidism  8. history of anxiety disorder 9. Vomiting - on PRN zofran. Will give additional phenergan once.   Signed, Bhagat,Bhavinkumar PA-C  As above, patient seen and examined. Cardiac catheterization results noted. Patient with severe coronary artery disease. Await surgical consult for consideration of coronary artery bypass graft. Continue aspirin, statin and metoprolol. Kirk Ruths

## 2014-11-21 NOTE — Progress Notes (Signed)
Discussed sternal precautions, mobility, IS, and d/c planning. Pt voices understanding but is very anxious about the surgery and her pain. She is fixated on getting the pain meds that work for her (brought this up with me three times). Able to inspire 2000 ml on IS. Sts she is having pressure substernally with deep breaths. Did not ambulate today. Gave [facility] book, guideline and video to watch. [place] CES, ACSM 3:47 PM 11/21/2014

## 2014-11-21 NOTE — Consult Note (Signed)
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 patient has severe multivessel disease with 2 chronically occluded arteries. The LAD itself does not appear to have any single lesions besides the second diagonal 70% lesion.  The patient did have anterior ischemia on the stress test suggesting some ischemia in the LAD distribution.  I have discussed the findings with Dr. Martinique to discuss possibility of CTO PCI, he feels that based on the extent of chronic occlusions and the fact there is anterior ischemia on stress test, she may benefit from consideration for CABG. Would potentially consider using the LIMA for a non-LAD lesion.   Plan:  Patient will return to her nursing unit for standard post radial cath care. He did receive extra heparin, ACT was just >250 seconds.  Continue aggressive risk factor modification.  We have consulted CT surgery to discuss findings with the patient to determine if she would be a surgical candidate. If necessary, could consider relook catheterization with FFR of LAD to confirm or deny presence of LAD ischemia.   Leonie Man, M.D., M.S. Interventional Cardiologist   Pager # [phone]      Coronary Findings    Dominance: Right   Left Main  The vessel was injected is large . TIG 4.0 Catheter Very Large     Left Anterior Descending   . Dist LAD lesion, 40% stenosed. diffuse .   Marland Kitchen First Diagonal Branch   The vessel is moderate in size and exhibits minimal luminal irregularities.   . Second Diagonal Branch   The vessel is moderate in size.   Colon Flattery 2nd Diag to 2nd Diag lesion, 70% stenosed. discrete located at the major branch .   Marland Kitchen Third Diagonal Branch   The vessel is small in size and exhibits minimal luminal irregularities.     Left Circumflex  The vessel is normal in caliber .   Marland Kitchen Prox Cx to Mid Cx lesion, 100% stenosed. mildly calcified diffuse chronic total occlusion .   Marland Kitchen Angioplasty: Lesion length: 30 mm.  Balloon angioplasty was not able to be performed. Pre-stent angioplasty was not performedThere is no pre-interventional antegrade distal flow. There is no post-interventional antegrade distal flow. the intervention was unsuccessful due to an inability to cross the lesion No complications occured at this lesion. IC nitroglycerin was given. GUIDE CATH RUNWAY 6FR CLS4 ([id number]) WIRE INTUITION PROPEL ST 180CM (INTU180SS) WIRE PT2 LS 185 ([id number])  . PCI: A stent was not placed.  . There is a 100% residual stenosis post intervention.     . First Obtuse Marginal Branch   The vessel is small in size.   Marland Kitchen Second Obtuse Marginal Branch   The vessel is moderate in size. 2nd Mrg filled by collaterals from 1st Diag. 2nd Mrg filled by collaterals from 2nd Diag.     Right Coronary Artery  The vessel was injected is normal in caliber . JR4 Catheter   . Prox RCA lesion, 70% stenosed. diffuse .   Marland Kitchen Mid RCA lesion, 90% stenosed. discrete .   Marland Kitchen Mid RCA to Dist RCA lesion, 100% stenosed. chronic total occlusion located proximal to major branch .   Marland Kitchen Right Posterior Descending Artery   The vessel is small in size. RPDA filled by collaterals from 1st Sept.   . Right Posterior Atrioventricular Branch   The vessel is small in size.   Marland Kitchen Second Right Posterolateral   2nd RPLB filled by collaterals from 2nd Diag.      Wall Motion                 Left Heart    Left Ventricle The left ventricular size is normal. The left ventricular systolic function is normal. There are wall motion abnormalities in the left ventricle. There are segmental wall motion abnormalities in the left ventricle.  Mitral Valve There is no mitral valve regurgitation.   Aortic Valve There is no aortic valve stenosis, and no aortic valve regurgitation.    Coronary Diagrams    Diagnostic Diagram           Post-Intervention Diagram            Implants    Name ID Temporary Type Supply   No information to  display    Hemo Data       Most Recent Value   AO Systolic Pressure  026 mmHg   AO Diastolic Pressure  74 mmHg   AO Mean  91 mmHg   LV Systolic Pressure  378 mmHg   LV Diastolic Pressure  7 mmHg   LV EDP  13 mmHg   Arterial Occlusion Pressure Extended Systolic Pressure  588 mmHg   Arterial Occlusion Pressure Extended Diastolic Pressure  76 mmHg   Arterial Occlusion Pressure Extended Mean Pressure  91 mmHg   Left Ventricular Apex Extended Systolic Pressure  502 mmHg   Left Ventricular Apex Extended Diastolic Pressure  5 mmHg   Left Ventricular Apex Extended EDP Pressure  12 mmHg      Impression:  Patient has severe three-vessel coronary artery disease with preserved left ventricular systolic function. She presents with long-standing history of symptoms of chest pain and exertional shortness of breath. Symptoms seem to be somewhat atypical, but much of what she describes may be consistent with angina pectoris. I personally reviewed the patient's recent transthoracic echocardiogram and diagnostic cardiac catheterization. The patient does not have high-grade stenosis of the left anterior descending coronary artery, but despite this I don't think there is much question that surgical revascularization would be associated with the best outcome in terms of both relief from symptoms of angina pectoris and improved long-term survival.   Plan:  I have reviewed the indications, risks, and potential benefits of coronary artery bypass grafting with the patient this morning in her hospital room.  Alternative treatment strategies have been discussed, including the relative risks, benefits and long term prognosis associated with medical therapy, percutaneous coronary intervention, and surgical revascularization.  The patient understands and accepts all potential associated risks of surgery including but not limited to risk of death, stroke or other neurologic complication, myocardial  infarction, congestive heart failure, respiratory failure, renal failure, bleeding requiring blood transfusion and/or reexploration, aortic dissection or other major vascular complication, arrhythmia, heart block or bradycardia requiring permanent pacemaker, pneumonia, pleural effusion, wound infection, pulmonary embolus or other thromboembolic complication, chronic pain or other delayed complications related to median sternotomy, or the late recurrence of symptomatic ischemic heart disease and/or congestive heart failure.  The importance of long term risk modification have been emphasized.  All questions answered.  The patient is interested in proceeding with coronary artery bypass grafting in the near future. We tentatively plan for surgery on Wednesday, 11/26/2014.  Presuming that the patient will remain in the hospital, I will plan to follow-up with the patient on Monday.  If the patient is discharged from the hospital we will make appropriate arrangements for follow-up and elective surgery next week.   I spent in excess of 120 minutes during the conduct of this hospital consultation and >50% of this time involved direct face-to-face encounter for counseling and/or coordination of the patient's care.   Valentina Gu. Roxy Manns, MD 11/21/2014 11:01 AM

## 2014-11-22 ENCOUNTER — Other Ambulatory Visit: Payer: Self-pay

## 2014-11-22 ENCOUNTER — Other Ambulatory Visit (HOSPITAL_COMMUNITY): Payer: Medicare Other

## 2014-11-22 DIAGNOSIS — I25119 Atherosclerotic heart disease of native coronary artery with unspecified angina pectoris: Secondary | ICD-10-CM

## 2014-11-22 LAB — URINALYSIS, ROUTINE W REFLEX MICROSCOPIC
Bilirubin Urine: NEGATIVE
Glucose, UA: NEGATIVE mg/dL
Hgb urine dipstick: NEGATIVE
KETONES UR: NEGATIVE mg/dL
NITRITE: POSITIVE — AB
PROTEIN: NEGATIVE mg/dL
Specific Gravity, Urine: 1.007 (ref 1.005–1.030)
Urobilinogen, UA: 0.2 mg/dL (ref 0.0–1.0)
pH: 6.5 (ref 5.0–8.0)

## 2014-11-22 LAB — URINE MICROSCOPIC-ADD ON

## 2014-11-22 LAB — BASIC METABOLIC PANEL
ANION GAP: 10 (ref 5–15)
BUN: 10 mg/dL (ref 6–20)
CHLORIDE: 97 mmol/L — AB (ref 101–111)
CO2: 29 mmol/L (ref 22–32)
Calcium: 9 mg/dL (ref 8.9–10.3)
Creatinine, Ser: 1.02 mg/dL — ABNORMAL HIGH (ref 0.44–1.00)
GFR calc Af Amer: >60 mL/min (ref >60)
GLUCOSE: 95 mg/dL (ref 65–99)
POTASSIUM: 4.5 mmol/L (ref 3.5–5.1)
SODIUM: 136 mmol/L (ref 135–145)

## 2014-11-22 MED ORDER — ZOLPIDEM TARTRATE 5 MG PO TABS
5.0000 mg | ORAL_TABLET | Freq: Every evening | ORAL | Status: DC | PRN
  Administered 2014-11-22 – 2014-11-25 (×4): 5 mg via ORAL
  Filled 2014-11-22 (×4): qty 1

## 2014-11-22 MED ORDER — MORPHINE SULFATE ER 15 MG PO TBCR
45.0000 mg | EXTENDED_RELEASE_TABLET | Freq: Three times a day (TID) | ORAL | Status: DC
  Administered 2014-11-22 – 2014-11-25 (×12): 45 mg via ORAL
  Filled 2014-11-22 (×12): qty 3

## 2014-11-22 MED ORDER — NITROFURANTOIN MONOHYD MACRO 100 MG PO CAPS
100.0000 mg | ORAL_CAPSULE | Freq: Two times a day (BID) | ORAL | Status: DC
  Administered 2014-11-22 – 2014-11-25 (×8): 100 mg via ORAL
  Filled 2014-11-22 (×11): qty 1

## 2014-11-22 NOTE — Progress Notes (Signed)
Dr. Candyce Churn paged regarding pt c/o pain in r shoulder and upper back. She says she is unable to take deep breaths because of this pain. Pt has never been totally pain free. ? Pleuritic pain. Pt stated that she had a cough for 3 days PTA. Pt also c/o L eye burning and tearing.

## 2014-11-22 NOTE — Progress Notes (Signed)
Patient Name: Margarett Viti Date of Encounter: 11/22/2014     Active Problems:   HLD (hyperlipidemia)   Anxiety state   Atypical chest pain   Abnormal nuclear stress test   Atypical angina   Coronary artery disease involving native coronary artery with angina pectoris   Tobacco abuse    SUBJECTIVE  Had mild chest discomfort during the night.  Continues to have a lot of chronic back pain for which she has been on long-term MS Contin at home.  Not sleeping well. Telemetry shows normal sinus rhythm  CURRENT MEDS . amitriptyline  150 mg Oral Daily  . aspirin EC  81 mg Oral Daily  . atorvastatin  40 mg Oral Daily  . cholecalciferol  2,000 Units Oral Daily  . citalopram  10 mg Oral Daily  . esomeprazole  80 mg Oral Daily  . folic acid  1 mg Oral QPM  . heparin  5,000 Units Subcutaneous 3 times per day  . levothyroxine  75 mcg Oral QPM  . metoprolol succinate  25 mg Oral Daily  . morphine  45 mg Oral TID  . sodium chloride  3 mL Intravenous Q12H    OBJECTIVE  Filed Vitals:   11/21/14 2033 11/22/14 0120 11/22/14 0410 11/22/14 0430  BP: 106/66 110/73 107/67   Pulse: 74 78 74 76  Temp: 98.5 F (36.9 C) 98.2 F (36.8 C) 98.7 F (37.1 C)   TempSrc: Oral Oral Oral   Resp: 18 18 18    Height:      Weight:      SpO2: 97% 99% 96%     Intake/Output Summary (Last 24 hours) at 11/22/14 1047 Last data filed at 11/22/14 0930  Gross per 24 hour  Intake      3 ml  Output    500 ml  Net   -497 ml   Filed Weights   11/20/14 0546 11/21/14 0548  Weight: 243 lb 9.6 oz (110.496 kg) 249 lb 9.6 oz (113.218 kg)    PHYSICAL EXAM  General: Pleasant, NAD. Neuro: Alert and oriented X 3. Moves all extremities spontaneously. Psych: Normal affect. HEENT:  Normal  Neck: Supple without bruits or JVD. Lungs:  Resp regular and unlabored, CTA. Heart: RRR no s3, s4, or murmurs. Abdomen: Soft, non-tender, non-distended, BS + x 4.  Extremities: No clubbing, cyanosis or edema. DP/PT/Radials  2+ and equal bilaterally.  Accessory Clinical Findings  CBC  Recent Labs  11/19/14 1745  WBC 10.3  HGB 10.7*  HCT 34.7*  MCV 86.3  PLT 620*   Basic Metabolic Panel  Recent Labs  11/21/14 1046 11/22/14 0354  NA 136 136  K 4.6 4.5  CL 99* 97*  CO2 29 29  GLUCOSE 114* 95  BUN 11 10  CREATININE 0.93 1.02*  CALCIUM 8.9 9.0   Liver Function Tests No results for input(s): AST, ALT, ALKPHOS, BILITOT, PROT, ALBUMIN in the last 72 hours. No results for input(s): LIPASE, AMYLASE in the last 72 hours. Cardiac Enzymes  Recent Labs  11/19/14 1745 11/19/14 2132 11/20/14 0510  TROPONINI <0.03 <0.03 <0.03   BNP Invalid input(s): POCBNP D-Dimer No results for input(s): DDIMER in the last 72 hours. Hemoglobin A1C No results for input(s): HGBA1C in the last 72 hours. Fasting Lipid Panel No results for input(s): CHOL, HDL, LDLCALC, TRIG, CHOLHDL, LDLDIRECT in the last 72 hours. Thyroid Function Tests No results for input(s): TSH, T4TOTAL, T3FREE, THYROIDAB in the last 72 hours.  Invalid input(s): FREET3  TELE  Normal sinus rhythm  ECG    Radiology/Studies  Dg Chest 2 View  11/19/2014   CLINICAL DATA:  Chest pain, pre cardiac catheterization  EXAM: CHEST  2 VIEW  COMPARISON:  08/14/2014  FINDINGS: The heart size and mediastinal contours are within normal limits. Both lungs are clear. The visualized skeletal structures are unremarkable.  IMPRESSION: No active cardiopulmonary disease.   Electronically Signed   By: Lahoma Crocker M.D.   On: 11/19/2014 09:40    ASSESSMENT AND PLAN 1.  Multivessel coronary artery disease with 2 chronically occluded arteries.  She has been seen by Dr. Roxy Manns .  CABG is planned for next week. 2.  Chronic pain syndrome with chronic lower back pain 3. Palpitation: Continue troprol 4. Hyperlipidemia: Continue statin 5. tobacco abuse: advice smoking cessation 6. H/o polysubstance abuse: last marijuana use 1 week ago - advise abstinence 7.  hypothyroidism  8. history of anxiety disorder 9. Vomiting - on PRN zofran.  10.  Insomnia.  She had been taking 20 mg of Ambien at home.  We will increase her from 5 mg up to 10 mg here in the hospital.  Signed, Darlin Coco MD

## 2014-11-23 ENCOUNTER — Inpatient Hospital Stay (HOSPITAL_COMMUNITY): Payer: Medicare Other

## 2014-11-23 LAB — BASIC METABOLIC PANEL
Anion gap: 11 (ref 5–15)
BUN: 13 mg/dL (ref 6–20)
CO2: 26 mmol/L (ref 22–32)
Calcium: 9.5 mg/dL (ref 8.9–10.3)
Chloride: 98 mmol/L — ABNORMAL LOW (ref 101–111)
Creatinine, Ser: 1.08 mg/dL — ABNORMAL HIGH (ref 0.44–1.00)
GFR calc Af Amer: >60 mL/min (ref >60)
GLUCOSE: 105 mg/dL — AB (ref 65–99)
POTASSIUM: 4.9 mmol/L (ref 3.5–5.1)
Sodium: 135 mmol/L (ref 135–145)

## 2014-11-23 MED ORDER — POLYETHYLENE GLYCOL 3350 17 G PO PACK
17.0000 g | PACK | Freq: Every day | ORAL | Status: DC
  Administered 2014-11-23 – 2014-11-24 (×3): 17 g via ORAL
  Filled 2014-11-23 (×3): qty 1

## 2014-11-23 MED ORDER — ALUM & MAG HYDROXIDE-SIMETH 200-200-20 MG/5ML PO SUSP
30.0000 mL | ORAL | Status: DC | PRN
Start: 2014-11-23 — End: 2014-11-26
  Administered 2014-11-23 – 2014-11-24 (×3): 30 mL via ORAL
  Filled 2014-11-23 (×3): qty 30

## 2014-11-23 MED ORDER — SENNA 8.6 MG PO TABS: 1.0000 | ORAL_TABLET | Freq: Two times a day (BID) | ORAL | Status: DC | PRN

## 2014-11-23 MED ORDER — SENNOSIDES-DOCUSATE SODIUM 8.6-50 MG PO TABS
1.0000 | ORAL_TABLET | Freq: Two times a day (BID) | ORAL | Status: DC
  Administered 2014-11-23 – 2014-11-25 (×5): 1 via ORAL
  Filled 2014-11-23 (×5): qty 1

## 2014-11-23 NOTE — Progress Notes (Signed)
Pt appears to be less responsive, slow to respond, and appears to be overly sleepy.  Nell Range, PA notified.  Will continue to closely monitor.

## 2014-11-23 NOTE — Progress Notes (Signed)
Patient Name: Alaze Garverick Date of Encounter: 11/23/2014     Active Problems:   HLD (hyperlipidemia)   Anxiety state   Atypical chest pain   Abnormal nuclear stress test   Atypical angina   Coronary artery disease involving native coronary artery with angina pectoris   Tobacco abuse    SUBJECTIVE  No chest pain overnight.  The patient is on Macrobid for pyuria.  Earlier today she was complaining of some generalized abdominal pain.  A KUB has been ordered.  CURRENT MEDS . amitriptyline  150 mg Oral Daily  . aspirin EC  81 mg Oral Daily  . atorvastatin  40 mg Oral Daily  . cholecalciferol  2,000 Units Oral Daily  . citalopram  10 mg Oral Daily  . esomeprazole  80 mg Oral Daily  . folic acid  1 mg Oral QPM  . heparin  5,000 Units Subcutaneous 3 times per day  . levothyroxine  75 mcg Oral QPM  . metoprolol succinate  25 mg Oral Daily  . morphine  45 mg Oral TID  . nitrofurantoin (macrocrystal-monohydrate)  100 mg Oral Q12H  . polyethylene glycol  17 g Oral Daily  . sodium chloride  3 mL Intravenous Q12H    OBJECTIVE  Filed Vitals:   11/22/14 1335 11/22/14 1943 11/22/14 2200 11/23/14 0444  BP: 107/76 127/81 130/67 109/77  Pulse: 96 94 95 118  Temp: 98.6 F (37 C) 98.4 F (36.9 C)  98.7 F (37.1 C)  TempSrc: Oral Oral  Oral  Resp: 17 20 22 20   Height:      Weight:    240 lb 14.4 oz (109.272 kg)  SpO2: 96% 94% 95% 96%    Intake/Output Summary (Last 24 hours) at 11/23/14 1049 Last data filed at 11/23/14 0300  Gross per 24 hour  Intake   1063 ml  Output      0 ml  Net   1063 ml   Filed Weights   11/20/14 0546 11/21/14 0548 11/23/14 0444  Weight: 243 lb 9.6 oz (110.496 kg) 249 lb 9.6 oz (113.218 kg) 240 lb 14.4 oz (109.272 kg)    PHYSICAL EXAM  General: Pleasant, NAD. Neuro: Alert and oriented X 3. Moves all extremities spontaneously. Psych: Normal affect. HEENT:  Normal  Neck: Supple without bruits or JVD. Lungs:  Resp regular and unlabored,  CTA. Heart: Sinus tachycardia with activity in the room.  no s3, s4, or murmurs. Abdomen: Her abdomen is soft.  Bowel sounds are normal.  No localizing tenderness to palpation. Extremities: No clubbing, cyanosis or edema. DP/PT/Radials 2+ and equal bilaterally.  Accessory Clinical Findings  CBC No results for input(s): WBC, NEUTROABS, HGB, HCT, MCV, PLT in the last 72 hours. Basic Metabolic Panel  Recent Labs  11/22/14 0354 11/23/14 0343  NA 136 135  K 4.5 4.9  CL 97* 98*  CO2 29 26  GLUCOSE 95 105*  BUN 10 13  CREATININE 1.02* 1.08*  CALCIUM 9.0 9.5   Liver Function Tests No results for input(s): AST, ALT, ALKPHOS, BILITOT, PROT, ALBUMIN in the last 72 hours. No results for input(s): LIPASE, AMYLASE in the last 72 hours. Cardiac Enzymes No results for input(s): CKTOTAL, CKMB, CKMBINDEX, TROPONINI in the last 72 hours. BNP Invalid input(s): POCBNP D-Dimer No results for input(s): DDIMER in the last 72 hours. Hemoglobin A1C No results for input(s): HGBA1C in the last 72 hours. Fasting Lipid Panel No results for input(s): CHOL, HDL, LDLCALC, TRIG, CHOLHDL, LDLDIRECT in the last 72  hours. Thyroid Function Tests No results for input(s): TSH, T4TOTAL, T3FREE, THYROIDAB in the last 72 hours.  Invalid input(s): FREET3  TELE  Sinus tachycardia  ECG    Radiology/Studies  Dg Chest 2 View  11/19/2014   CLINICAL DATA:  Chest pain, pre cardiac catheterization  EXAM: CHEST  2 VIEW  COMPARISON:  08/14/2014  FINDINGS: The heart size and mediastinal contours are within normal limits. Both lungs are clear. The visualized skeletal structures are unremarkable.  IMPRESSION: No active cardiopulmonary disease.   Electronically Signed   By: Lahoma Crocker M.D.   On: 11/19/2014 09:40    ASSESSMENT AND PLAN 1. Multivessel coronary artery disease with 2 chronically occluded arteries. She has been seen by Dr. Roxy Manns . CABG is planned for next week. 2. Chronic pain syndrome with chronic  lower back pain 3. Palpitation: Continue troprol 4. Hyperlipidemia: Continue statin 5. tobacco abuse: advice smoking cessation 6. H/o polysubstance abuse: last marijuana use 1 week ago - advise abstinence 7. hypothyroidism  8. history of anxiety disorder 9. Vomiting - on PRN zofran.  10. Insomnia. She had been taking 20 mg of Ambien at home.Discussed with pharmacy.  For females, 5 mg is the maximum dose. 11.  Abdominal pain.  Physical examination this morning shows no localized tenderness.  A KUB is pending. 12.  Urinary tract infection with pyuria.  On Macrobid.   Signed, Darlin Coco MD

## 2014-11-23 NOTE — Care Management Important Message (Signed)
Important Message  Patient Details  Name: Candice Butler MRN: [id number] Date of Birth: 07/23/1975   Medicare Important Message Given:  Yes-second notification given    Apolonio Schneiders, RN 11/23/2014, 12:30 PM

## 2014-11-23 NOTE — Progress Notes (Signed)
     Yesterday complained on dysuria and we did a UA that showed + bacteria. We placed her on Macrobid as she has hx of allergy to cipro. Today she complains of abdominal pain and has a hx of hernia with mesh placement. Will order a KUB.   Angelena Form PA-C  [facility]

## 2014-11-24 ENCOUNTER — Inpatient Hospital Stay (HOSPITAL_COMMUNITY): Payer: Medicare Other

## 2014-11-24 DIAGNOSIS — I471 Supraventricular tachycardia: Secondary | ICD-10-CM

## 2014-11-24 DIAGNOSIS — Z720 Tobacco use: Secondary | ICD-10-CM

## 2014-11-24 DIAGNOSIS — R000 Tachycardia, unspecified: Secondary | ICD-10-CM | POA: Diagnosis present

## 2014-11-24 DIAGNOSIS — R072 Precordial pain: Secondary | ICD-10-CM | POA: Diagnosis present

## 2014-11-24 DIAGNOSIS — F411 Generalized anxiety disorder: Secondary | ICD-10-CM

## 2014-11-24 DIAGNOSIS — Z01810 Encounter for preprocedural cardiovascular examination: Secondary | ICD-10-CM

## 2014-11-24 LAB — BASIC METABOLIC PANEL
ANION GAP: 11 (ref 5–15)
BUN: 14 mg/dL (ref 6–20)
CALCIUM: 9 mg/dL (ref 8.9–10.3)
CO2: 26 mmol/L (ref 22–32)
Chloride: 95 mmol/L — ABNORMAL LOW (ref 101–111)
Creatinine, Ser: 1.12 mg/dL — ABNORMAL HIGH (ref 0.44–1.00)
GFR calc Af Amer: >60 mL/min (ref >60)
GLUCOSE: 124 mg/dL — AB (ref 65–99)
POTASSIUM: 4.8 mmol/L (ref 3.5–5.1)
Sodium: 132 mmol/L — ABNORMAL LOW (ref 135–145)

## 2014-11-24 LAB — TSH: TSH: 1.203 u[IU]/mL (ref 0.350–4.500)

## 2014-11-24 MED ORDER — BISACODYL 10 MG RE SUPP
10.0000 mg | Freq: Once | RECTAL | Status: AC
  Administered 2014-11-24: 10 mg via RECTAL
  Filled 2014-11-24: qty 1

## 2014-11-24 MED ORDER — METOPROLOL SUCCINATE ER 50 MG PO TB24
50.0000 mg | ORAL_TABLET | Freq: Every day | ORAL | Status: DC
  Administered 2014-11-24 – 2014-11-25 (×2): 50 mg via ORAL
  Filled 2014-11-24: qty 1

## 2014-11-24 NOTE — Progress Notes (Signed)
      [address]       [place] [address]             [phone]     CARDIOTHORACIC SURGERY PROGRESS NOTE  4 Days Post-Op  S/P Procedure(s) (LRB): Left Heart Cath and Coronary Angiography (N/A) Coronary Balloon Angioplasty  Subjective: Feels better after having 2 bowel movements today  Objective: Vital signs in last 24 hours: Temp:  [98.1 F (36.7 C)-98.6 F (37 C)] 98.4 F (36.9 C) (09/12 1300) Pulse Rate:  [104-122] 122 (09/12 1300) Cardiac Rhythm:  [-] Normal sinus rhythm (09/12 0700) Resp:  [16-18] 16 (09/12 1300) BP: (108-121)/(70-80) 117/75 mmHg (09/12 1300) SpO2:  [95 %-96 %] 95 % (09/12 1300) Weight:  [109.181 kg (240 lb 11.2 oz)] 109.181 kg (240 lb 11.2 oz) (09/12 0410)  Physical Exam:  Rhythm:   sinus  Breath sounds: clear  Heart sounds:  RRR  Incisions:  n/a  Abdomen:  soft  Extremities:  warm   Intake/Output from previous day: 09/11 0701 - 09/12 0700 In: 780 [P.O.:780] Out: -  Intake/Output this shift: Total I/O In: 920 [P.O.:920] Out: 800 [Urine:800]  Lab Results: No results for input(s): WBC, HGB, HCT, PLT in the last 72 hours. BMET:  Recent Labs  11/23/14 0343 11/24/14 0400  NA 135 132*  K 4.9 4.8  CL 98* 95*  CO2 26 26  GLUCOSE 105* 124*  BUN 13 14  CREATININE 1.08* 1.12*  CALCIUM 9.5 9.0    CBG (last 3)  No results for input(s): GLUCAP in the last 72 hours. PT/INR:  No results for input(s): LABPROT, INR in the last 72 hours.   CXR: CHEST 2 VIEW  COMPARISON: 08/14/2014  FINDINGS: The heart size and mediastinal contours are within normal limits. Both lungs are clear. The visualized skeletal structures are unremarkable.  IMPRESSION: No active cardiopulmonary disease.   Electronically Signed  By: Lahoma Crocker M.D.  On: 11/19/2014 09:40   Assessment/Plan: S/P Procedure(s) (LRB): Left Heart Cath and Coronary Angiography (N/A) Coronary Balloon Angioplasty  We tentatively plan CABG on  Wednesday 9/14  I spent in excess of 15 minutes during the conduct of this hospital encounter and >50% of this time involved direct face-to-face encounter with the patient for counseling and/or coordination of their care.    Rexene Alberts 11/24/2014 6:12 PM

## 2014-11-24 NOTE — Progress Notes (Signed)
Pre-op Cardiac Surgery  Carotid Findings:  1-39% plaquing.  Vertebral artery flow is antegrade.    Upper Extremity Right Left  Brachial Pressures 129T 127T  Radial Waveforms T T  Ulnar Waveforms T T  Palmar Arch (Allen's Test) WNL Doppler signal remains normal with radial compression and obliterates with ulnar compression.   Findings:      Lower  Extremity Right Left  Dorsalis Pedis    Anterior Tibial    Posterior Tibial    Ankle/Brachial Indices      Findings:

## 2014-11-24 NOTE — Progress Notes (Signed)
Patient Name: Candice Butler Date of Encounter: 11/24/2014  Active Problems:  HLD (hyperlipidemia)  Anxiety state  Atypical chest pain  Abnormal nuclear stress test  Atypical angina  Coronary artery disease involving native coronary artery with angina pectoris  Tobacco abuse    SUBJECTIVE  Heart rates elevates to 140 with minimal exertion. Her rate was in 130-460s last night. Pt patient, she does have underlying sleep apnea. She had sleep study done few years ago at [place], however never placed on CPAP. Still has substernal chest discomfort. No SOB or palpitations. No abdominal pain.   CURRENT MEDS . amitriptyline  150 mg Oral Daily  . aspirin EC  81 mg Oral Daily  . atorvastatin  40 mg Oral Daily  . cholecalciferol  2,000 Units Oral Daily  . citalopram  10 mg Oral Daily  . esomeprazole  80 mg Oral Daily  . folic acid  1 mg Oral QPM  . heparin  5,000 Units Subcutaneous 3 times per day  . levothyroxine  75 mcg Oral QPM  . metoprolol succinate  25 mg Oral Daily  . morphine  45 mg Oral TID  . nitrofurantoin (macrocrystal-monohydrate)  100 mg Oral Q12H  . polyethylene glycol  17 g Oral Daily  . senna-docusate  1 tablet Oral BID  . sodium chloride  3 mL Intravenous Q12H    OBJECTIVE  Filed Vitals:   11/23/14 0444 11/23/14 1449 11/23/14 2101 11/24/14 0410  BP: 109/77 111/69 108/70 115/70  Pulse: 118 94 105 109  Temp: 98.7 F (37.1 C) 98.6 F (37 C) 98.6 F (37 C) 98.5 F (36.9 C)  TempSrc: Oral Oral Oral Oral  Resp: 20 18  18   Height:      Weight: 240 lb 14.4 oz (109.272 kg)   240 lb 11.2 oz (109.181 kg)  SpO2: 96% 97% 95% 95%    Intake/Output Summary (Last 24 hours) at 11/24/14 0845 Last data filed at 11/23/14 1817  Gross per 24 hour  Intake    540 ml  Output      0 ml  Net    540 ml   Filed Weights   11/21/14 0548 11/23/14 0444 11/24/14 0410  Weight: 249 lb 9.6 oz (113.218 kg) 240 lb 14.4 oz (109.272 kg) 240 lb 11.2 oz (109.181 kg)    PHYSICAL  EXAM  General: Pleasant, NAD. Neuro: Alert and oriented X 3. Moves all extremities spontaneously. Psych: Normal affect. HEENT:  Normal  Neck: Supple without bruits or JVD. Lungs:  Resp regular and unlabored, CTA. Heart:tachycardia no s3, s4, or murmurs. Abdomen: Soft, non-tender, non-distended, BS + x 4.  Extremities: No clubbing, cyanosis or edema. DP/PT/Radials 2+ and equal bilaterally.  Accessory Clinical Findings  Basic Metabolic Panel  Recent Labs  11/23/14 0343 11/24/14 0400  NA 135 132*  K 4.9 4.8  CL 98* 95*  CO2 26 26  GLUCOSE 105* 124*  BUN 13 14  CREATININE 1.08* 1.12*  CALCIUM 9.5 9.0    TELE  Sinus tachycardia  Radiology/Studies  Dg Chest 2 View  11/19/2014   CLINICAL DATA:  Chest pain, pre cardiac catheterization  EXAM: CHEST  2 VIEW  COMPARISON:  08/14/2014  FINDINGS: The heart size and mediastinal contours are within normal limits. Both lungs are clear. The visualized skeletal structures are unremarkable.  IMPRESSION: No active cardiopulmonary disease.   Electronically Signed   By: Lahoma Crocker M.D.   On: 11/19/2014 09:40   Dg Abd 1 View  11/23/2014   CLINICAL  DATA:  Lower abdominal pain beginning yesterday.  EXAM: ABDOMEN - 1 VIEW  COMPARISON:  None.  FINDINGS: There is a moderate amount of stool throughout the colon. No dilated loops of bowel are seen to suggest obstruction. Surgical clips are present throughout the abdomen, and prior mesh hernia repair is noted. No acute osseous abnormality is identified.  IMPRESSION: Moderate amount of colonic stool.  No evidence of bowel obstruction.   Electronically Signed   By: Logan Bores M.D.   On: 11/23/2014 14:08    ASSESSMENT AND PLAN 1. Multivessel coronary artery disease with 2 chronically occluded arteries.  - CABG is planned for 11/26/14. Continue [place] heparin.   2. Chronic pain syndrome with chronic lower back pain : stable 3. Palpitation: Continue troprol 4. Hyperlipidemia: Continue statin, pending  lipid panel.  5. Tobacco abuse: advice smoking cessation 6. H/o polysubstance abuse: last marijuana use 1 week ago - advise abstinence 7. hypothyroidism  8. history of anxiety disorder 9. Vomiting - on PRN zofran.  10. Insomnia. She had been taking 20 mg of Ambien at home.Discussed with pharmacy - For females, 5 mg is the maximum dose. Meds adjusted.  11. Abdominal pain.  KUB showed moderate colonic stool, no obstruction. Given bowel regimen. Currently no abdominal pain.  12. Urinary tract infection with pyuria. On Macrobid.  13: Sinus tachy - rate elevates with minimal activity. Last night rate was in 130-150s. Will increase Toprol dose 50mg  daily. BP stable, follow closely. Hold for SBP less than 90. Will get TSH as well.  She does have underlying sleep apnea. She had sleep study done few years ago at [place], however never placed on CPAP. Consider outpatient evaluation. Can try CPAP at night.   Signed, Akasha Melena PA-C

## 2014-11-25 ENCOUNTER — Inpatient Hospital Stay (HOSPITAL_COMMUNITY): Payer: Medicare Other

## 2014-11-25 ENCOUNTER — Telehealth: Payer: Self-pay | Admitting: Family Medicine

## 2014-11-25 LAB — CBC
HCT: 34.7 % — ABNORMAL LOW (ref 36.0–46.0)
Hemoglobin: 11.2 g/dL — ABNORMAL LOW (ref 12.0–15.0)
MCH: 27.3 pg (ref 26.0–34.0)
MCHC: 32.3 g/dL (ref 30.0–36.0)
MCV: 84.4 fL (ref 78.0–100.0)
PLATELETS: 317 10*3/uL (ref 150–400)
RBC: 4.11 MIL/uL (ref 3.87–5.11)
RDW: 14.5 % (ref 11.5–15.5)
WBC: 5.5 10*3/uL (ref 4.0–10.5)

## 2014-11-25 LAB — COMPREHENSIVE METABOLIC PANEL
ALT: 73 U/L — AB (ref 14–54)
AST: 77 U/L — AB (ref 15–41)
Albumin: 3.5 g/dL (ref 3.5–5.0)
Alkaline Phosphatase: 186 U/L — ABNORMAL HIGH (ref 38–126)
Anion gap: 10 (ref 5–15)
BUN: 12 mg/dL (ref 6–20)
CHLORIDE: 97 mmol/L — AB (ref 101–111)
CO2: 28 mmol/L (ref 22–32)
CREATININE: 1.02 mg/dL — AB (ref 0.44–1.00)
Calcium: 8.9 mg/dL (ref 8.9–10.3)
GFR calc non Af Amer: >60 mL/min (ref >60)
Glucose, Bld: 106 mg/dL — ABNORMAL HIGH (ref 65–99)
Potassium: 4.2 mmol/L (ref 3.5–5.1)
SODIUM: 135 mmol/L (ref 135–145)
Total Bilirubin: 0.3 mg/dL (ref 0.3–1.2)
Total Protein: 7.6 g/dL (ref 6.5–8.1)

## 2014-11-25 LAB — PULMONARY FUNCTION TEST
DL/VA % pred: 101 %
DL/VA: 5.33 ml/min/mmHg/L
DLCO COR % PRED: 80 %
DLCO UNC: 21.95 ml/min/mmHg
DLCO cor: 23.73 ml/min/mmHg
DLCO unc % pred: 74 %
FEF 25-75 POST: 4.76 L/s
FEF 25-75 Pre: 3.48 L/sec
FEF2575-%CHANGE-POST: 36 %
FEF2575-%PRED-POST: 141 %
FEF2575-%Pred-Pre: 103 %
FEV1-%Change-Post: 14 %
FEV1-%PRED-POST: 92 %
FEV1-%Pred-Pre: 80 %
FEV1-Post: 3.13 L
FEV1-Pre: 2.73 L
FEV1FVC-%CHANGE-POST: 2 %
FEV1FVC-%PRED-PRE: 107 %
FEV6-%Change-Post: 12 %
FEV6-%Pred-Post: 84 %
FEV6-%Pred-Pre: 74 %
FEV6-POST: 3.47 L
FEV6-Pre: 3.08 L
FEV6FVC-%PRED-POST: 102 %
FEV6FVC-%Pred-Pre: 102 %
FVC-%CHANGE-POST: 12 %
FVC-%PRED-PRE: 73 %
FVC-%Pred-Post: 82 %
FVC-POST: 3.47 L
FVC-PRE: 3.08 L
POST FEV1/FVC RATIO: 90 %
PRE FEV1/FVC RATIO: 88 %
Post FEV6/FVC ratio: 100 %
Pre FEV6/FVC Ratio: 100 %
RV % pred: 77 %
RV: 1.39 L
TLC % pred: 83 %
TLC: 4.7 L

## 2014-11-25 LAB — LIPID PANEL
CHOL/HDL RATIO: 6.2 ratio
Cholesterol: 268 mg/dL — ABNORMAL HIGH (ref 0–200)
HDL: 43 mg/dL (ref >40)
LDL CALC: 187 mg/dL — AB (ref 0–99)
Triglycerides: 191 mg/dL — ABNORMAL HIGH (ref <150)
VLDL: 38 mg/dL (ref 0–40)

## 2014-11-25 LAB — BLOOD GAS, ARTERIAL
Acid-Base Excess: 3.4 mmol/L — ABNORMAL HIGH (ref 0.0–2.0)
Bicarbonate: 28.1 mEq/L — ABNORMAL HIGH (ref 20.0–24.0)
Drawn by: 441661
FIO2: 0.21
O2 SAT: 93.7 %
PO2 ART: 79.3 mmHg — AB (ref 80.0–100.0)
Patient temperature: 98.6
TCO2: 29.6 mmol/L (ref 0–100)
pCO2 arterial: 48.4 mmHg — ABNORMAL HIGH (ref 35.0–45.0)
pH, Arterial: 7.383 (ref 7.350–7.450)

## 2014-11-25 LAB — ABO/RH: ABO/RH(D): B POS

## 2014-11-25 LAB — SURGICAL PCR SCREEN
MRSA, PCR: NEGATIVE
Staphylococcus aureus: NEGATIVE

## 2014-11-25 LAB — APTT: APTT: 22 s — AB (ref 24–37)

## 2014-11-25 LAB — PREALBUMIN: Prealbumin: 14.9 mg/dL — ABNORMAL LOW (ref 18–38)

## 2014-11-25 LAB — PROTIME-INR
INR: 0.93 (ref 0.00–1.49)
PROTHROMBIN TIME: 12.6 s (ref 11.6–15.2)

## 2014-11-25 MED ORDER — TEMAZEPAM 15 MG PO CAPS
15.0000 mg | ORAL_CAPSULE | Freq: Once | ORAL | Status: AC | PRN
  Administered 2014-11-25: 15 mg via ORAL
  Filled 2014-11-25: qty 1

## 2014-11-25 MED ORDER — SODIUM CHLORIDE 0.9 % IV SOLN
INTRAVENOUS | Status: DC
  Administered 2014-11-26: 69.8 mL/h via INTRAVENOUS
  Filled 2014-11-25: qty 40

## 2014-11-25 MED ORDER — DOPAMINE-DEXTROSE 3.2-5 MG/ML-% IV SOLN
0.0000 ug/kg/min | INTRAVENOUS | Status: DC
  Filled 2014-11-25: qty 250

## 2014-11-25 MED ORDER — METOPROLOL TARTRATE 12.5 MG HALF TABLET
12.5000 mg | ORAL_TABLET | Freq: Once | ORAL | Status: AC
  Administered 2014-11-26: 12.5 mg via ORAL
  Filled 2014-11-25: qty 1

## 2014-11-25 MED ORDER — NITROGLYCERIN 0.4 MG/HR TD PT24
0.4000 mg | MEDICATED_PATCH | Freq: Every day | TRANSDERMAL | Status: DC
  Administered 2014-11-25: 0.4 mg via TRANSDERMAL
  Filled 2014-11-25 (×2): qty 1

## 2014-11-25 MED ORDER — NITROGLYCERIN IN D5W 200-5 MCG/ML-% IV SOLN
2.0000 ug/min | INTRAVENOUS | Status: DC
Start: 2014-11-26 — End: 2014-11-26
  Administered 2014-11-26: 5 ug/min via INTRAVENOUS
  Filled 2014-11-25: qty 250

## 2014-11-25 MED ORDER — SODIUM CHLORIDE 0.9 % IV SOLN
INTRAVENOUS | Status: DC
  Filled 2014-11-25: qty 30

## 2014-11-25 MED ORDER — EPINEPHRINE HCL 1 MG/ML IJ SOLN
0.0000 ug/min | INTRAVENOUS | Status: DC
  Filled 2014-11-25: qty 4

## 2014-11-25 MED ORDER — ALPRAZOLAM 0.5 MG PO TABS
0.5000 mg | ORAL_TABLET | Freq: Once | ORAL | Status: AC
  Administered 2014-11-26: 0.5 mg via ORAL
  Filled 2014-11-25: qty 1

## 2014-11-25 MED ORDER — CHLORHEXIDINE GLUCONATE 4 % EX LIQD
60.0000 mL | Freq: Once | CUTANEOUS | Status: AC
  Administered 2014-11-26: 4 via TOPICAL
  Filled 2014-11-25: qty 60

## 2014-11-25 MED ORDER — ALPRAZOLAM 0.5 MG PO TABS: 1.0000 mg | ORAL_TABLET | Freq: Once | ORAL | Status: DC

## 2014-11-25 MED ORDER — DEXTROSE 5 % IV SOLN
1.5000 g | INTRAVENOUS | Status: DC
  Administered 2014-11-26: 1.5 g via INTRAVENOUS
  Administered 2014-11-26: .75 g via INTRAVENOUS
  Filled 2014-11-25: qty 1.5

## 2014-11-25 MED ORDER — PHENYLEPHRINE HCL 10 MG/ML IJ SOLN
30.0000 ug/min | INTRAVENOUS | Status: DC
  Administered 2014-11-26: 40 ug/min via INTRAVENOUS
  Filled 2014-11-25: qty 2

## 2014-11-25 MED ORDER — DEXTROSE 5 % IV SOLN
750.0000 mg | INTRAVENOUS | Status: DC
  Filled 2014-11-25: qty 750

## 2014-11-25 MED ORDER — SODIUM CHLORIDE 0.9 % IV SOLN
1250.0000 mg | INTRAVENOUS | Status: DC
  Administered 2014-11-26: 1250 mg via INTRAVENOUS
  Filled 2014-11-25 (×2): qty 1250

## 2014-11-25 MED ORDER — CHLORHEXIDINE GLUCONATE 4 % EX LIQD
60.0000 mL | Freq: Once | CUTANEOUS | Status: AC
  Administered 2014-11-25: 4 via TOPICAL
  Filled 2014-11-25: qty 60

## 2014-11-25 MED ORDER — PLASMA-LYTE 148 IV SOLN
INTRAVENOUS | Status: DC
  Filled 2014-11-25: qty 2.5

## 2014-11-25 MED ORDER — ALBUTEROL SULFATE (2.5 MG/3ML) 0.083% IN NEBU
2.5000 mg | INHALATION_SOLUTION | Freq: Once | RESPIRATORY_TRACT | Status: AC
  Administered 2014-11-25: 2.5 mg via RESPIRATORY_TRACT

## 2014-11-25 MED ORDER — BISACODYL 5 MG PO TBEC
5.0000 mg | DELAYED_RELEASE_TABLET | Freq: Once | ORAL | Status: AC
  Administered 2014-11-25: 5 mg via ORAL
  Filled 2014-11-25: qty 1

## 2014-11-25 MED ORDER — MAGNESIUM SULFATE 50 % IJ SOLN
40.0000 meq | INTRAMUSCULAR | Status: DC
  Filled 2014-11-25: qty 10

## 2014-11-25 MED ORDER — VANCOMYCIN HCL 1000 MG IV SOLR
INTRAVENOUS | Status: DC
  Filled 2014-11-25: qty 1000

## 2014-11-25 MED ORDER — POTASSIUM CHLORIDE 2 MEQ/ML IV SOLN
80.0000 meq | INTRAVENOUS | Status: DC
  Filled 2014-11-25: qty 40

## 2014-11-25 MED ORDER — SODIUM CHLORIDE 0.9 % IV SOLN
INTRAVENOUS | Status: DC
  Administered 2014-11-26: .7 [IU]/h via INTRAVENOUS
  Filled 2014-11-25: qty 2.5

## 2014-11-25 MED ORDER — DEXMEDETOMIDINE HCL IN NACL 400 MCG/100ML IV SOLN
0.1000 ug/kg/h | INTRAVENOUS | Status: DC
  Administered 2014-11-26: .5 ug/kg/h via INTRAVENOUS
  Filled 2014-11-25: qty 100

## 2014-11-25 NOTE — Progress Notes (Signed)
[facility] BRIEF PROGRESS NOTE   Clinically stable.  For OR tomorrow.  I have again reviewed the indications, risks, and potential benefits of coronary artery bypass grafting with the patient this evening.  She understands and accepts all potential associated risks of surgery including but not limited to risk of death, stroke or other neurologic complication, myocardial infarction, congestive heart failure, respiratory failure, renal failure, bleeding requiring blood transfusion and/or reexploration, aortic dissection or other major vascular complication, arrhythmia, heart block or bradycardia requiring permanent pacemaker, pneumonia, pleural effusion, wound infection, pulmonary embolus or other thromboembolic complication, chronic pain or other delayed complications related to median sternotomy, or the late recurrence of symptomatic ischemic heart disease and/or congestive heart failure.  The importance of long term risk modification have been emphasized.  All questions answered.   Rexene Alberts 11/25/2014 6:58 PM

## 2014-11-25 NOTE — Progress Notes (Signed)
CARDIAC REHAB PHASE I   PRE:  Rate/Rhythm: 93 SR    BP: sitting 110/77    SaO2: 97 RA  MODE:  Ambulation: 700 ft   POST:  Rate/Rhythm: 109 ST    BP: sitting 124/72     SaO2: 97 RA  Pt denies CP/angina. Does speak of upper abd pain that hurts when you press on it. Tolerated walking well, steady, talkative. Very anxious about surgery with many questions, esp about pain meds regimen. Will f/u after surgery. [id number]   Candice Butler [place] CES, ACSM 11/25/2014 2:38 PM

## 2014-11-25 NOTE — Progress Notes (Signed)
Patient Name: Candice Butler Date of Encounter: 11/25/2014  Active Problems:  HLD (hyperlipidemia)  Anxiety state  Atypical chest pain  Abnormal nuclear stress test  Atypical angina  Coronary artery disease involving native coronary artery with angina pectoris  Tobacco abuse    SUBJECTIVE  Rate improved on increased BB. She tried SL nitro x 1 yesterday that did helped her to reliever chest discomfort temporarily. No SOB or palpitations. No abdominal pain.   CURRENT MEDS . amitriptyline  150 mg Oral Daily  . aspirin EC  81 mg Oral Daily  . atorvastatin  40 mg Oral Daily  . cholecalciferol  2,000 Units Oral Daily  . citalopram  10 mg Oral Daily  . esomeprazole  80 mg Oral Daily  . folic acid  1 mg Oral QPM  . heparin  5,000 Units Subcutaneous 3 times per day  . levothyroxine  75 mcg Oral QPM  . metoprolol succinate  50 mg Oral Daily  . morphine  45 mg Oral TID  . nitrofurantoin (macrocrystal-monohydrate)  100 mg Oral Q12H  . polyethylene glycol  17 g Oral Daily  . senna-docusate  1 tablet Oral BID  . sodium chloride  3 mL Intravenous Q12H    OBJECTIVE  Filed Vitals:   11/24/14 1000 11/24/14 1300 11/24/14 2022 11/25/14 0621  BP: 121/80 117/75 124/82 113/78  Pulse: 104 122 117 106  Temp: 98.1 F (36.7 C) 98.4 F (36.9 C) 99.2 F (37.3 C) 97.7 F (36.5 C)  TempSrc: Oral Oral Oral Oral  Resp: 18 16 18 18   Height:      Weight:    239 lb 4.8 oz (108.546 kg)  SpO2: 96% 95% 95% 98%    Intake/Output Summary (Last 24 hours) at 11/25/14 0904 Last data filed at 11/24/14 1600  Gross per 24 hour  Intake    680 ml  Output    800 ml  Net   -120 ml   Filed Weights   11/23/14 0444 11/24/14 0410 11/25/14 0621  Weight: 240 lb 14.4 oz (109.272 kg) 240 lb 11.2 oz (109.181 kg) 239 lb 4.8 oz (108.546 kg)    PHYSICAL EXAM  General: Pleasant, NAD. Neuro: Alert and oriented X 3. Moves all extremities spontaneously. Psych: Normal affect. HEENT:  Normal  Neck: Supple  without bruits or JVD. Lungs:  Resp regular and unlabored, CTA. Heart: tachycardia no s3, s4, or murmurs. Abdomen: Soft, non-tender, non-distended, BS + x 4.  Extremities: No clubbing, cyanosis or edema. DP/PT/Radials 2+ and equal bilaterally.  Accessory Clinical Findings  Basic Metabolic Panel  Recent Labs  11/24/14 0400 11/25/14 0340  NA 132* 135  K 4.8 4.2  CL 95* 97*  CO2 26 28  GLUCOSE 124* 106*  BUN 14 12  CREATININE 1.12* 1.02*  CALCIUM 9.0 8.9    TELE  Sinus tachycardia at rate of 100s  Radiology/Studies  Dg Chest 2 View  11/19/2014   CLINICAL DATA:  Chest pain, pre cardiac catheterization  EXAM: CHEST  2 VIEW  COMPARISON:  08/14/2014  FINDINGS: The heart size and mediastinal contours are within normal limits. Both lungs are clear. The visualized skeletal structures are unremarkable.  IMPRESSION: No active cardiopulmonary disease.   Electronically Signed   By: Lahoma Crocker M.D.   On: 11/19/2014 09:40   Dg Abd 1 View  11/23/2014   CLINICAL DATA:  Lower abdominal pain beginning yesterday.  EXAM: ABDOMEN - 1 VIEW  COMPARISON:  None.  FINDINGS: There is a moderate amount of  stool throughout the colon. No dilated loops of bowel are seen to suggest obstruction. Surgical clips are present throughout the abdomen, and prior mesh hernia repair is noted. No acute osseous abnormality is identified.  IMPRESSION: Moderate amount of colonic stool.  No evidence of bowel obstruction.   Electronically Signed   By: Logan Bores M.D.   On: 11/23/2014 14:08    ASSESSMENT AND PLAN 1. Multivessel coronary artery disease with 2 chronically occluded arteries.  - CABG is planned for 11/26/14. Continue [place] heparin.  - She tried SL nitro x 1 yesterday that did helped her to reliever chest discomfort temporarily. Will place her on nitro patch.   2. Chronic pain syndrome with chronic lower back pain : stable 3. Palpitation: Continue troprol 4. Hyperlipidemia: Continue statin, pending lipid panel.   5. Tobacco abuse: advice smoking cessation 6. H/o polysubstance abuse: last marijuana use 1 week ago - advise abstinence 7. hypothyroidism  8. history of anxiety disorder - she was previously on benzo. Requesting anxiety medication prior to CABG tomorrow. Will discuss with MD.  9. Vomiting - on PRN zofran.  10. Insomnia. She had been taking 20 mg of Ambien at home.Discussed with pharmacy - For females, 5 mg is the maximum dose. Meds adjusted.  11. Abdominal pain.  KUB showed moderate colonic stool, no obstruction. Given bowel regimen. Currently no abdominal pain.  12. Urinary tract infection with pyuria. On Macrobid.  13: Sinus tachy - Rate improved on increased dose of BB. Continue to monitor.   14. Sleep apnea -  She had sleep study done few years ago at [place], however never placed on CPAP. Consider outpatient evaluation.   Signed, Bhagat,Bhavinkumar PA-C  History and all data above reviewed.  Patient examined.  I agree with the findings as above.  The patient exam reveals COR:RRR  ,  Lungs: CTAB  ,  Abd: Soft, NT, ND.  Multiple ecchymoses, Ext WWP. No edema  .  All available labs, radiology testing, previous records reviewed. Agree with documented assessment and plan. CP improved with sublingual NTG yesterday. Agree with nitro patch.  Will order Xanax 0.5mg  pre-op for anxiety tomorrow.  Skeet Latch P   11/25/2014 1:39 PM

## 2014-11-25 NOTE — Progress Notes (Signed)
abg sent down for testing

## 2014-11-25 NOTE — Telephone Encounter (Signed)
Vitamin D, Ergocalciferol, (DRISDOL) 50000 UNITS CAPS capsule ° °

## 2014-11-26 ENCOUNTER — Encounter (HOSPITAL_COMMUNITY): Payer: Self-pay | Admitting: Certified Registered"

## 2014-11-26 ENCOUNTER — Inpatient Hospital Stay (HOSPITAL_COMMUNITY): Payer: Medicare Other | Admitting: Anesthesiology

## 2014-11-26 ENCOUNTER — Inpatient Hospital Stay (HOSPITAL_COMMUNITY): Payer: Medicare Other

## 2014-11-26 ENCOUNTER — Other Ambulatory Visit: Payer: Self-pay

## 2014-11-26 ENCOUNTER — Encounter (HOSPITAL_COMMUNITY)
Admission: EM | Disposition: A | Discharge status: Home / Self Care | Payer: Medicare Other | Source: Home / Self Care | Attending: Interventional Cardiology

## 2014-11-26 ENCOUNTER — Other Ambulatory Visit: Payer: Self-pay | Admitting: Family Medicine

## 2014-11-26 DIAGNOSIS — Z951 Presence of aortocoronary bypass graft: Secondary | ICD-10-CM

## 2014-11-26 DIAGNOSIS — E559 Vitamin D deficiency, unspecified: Secondary | ICD-10-CM

## 2014-11-26 HISTORY — PX: CORONARY ARTERY BYPASS GRAFT: SHX141

## 2014-11-26 HISTORY — PX: TEE WITHOUT CARDIOVERSION: SHX5443

## 2014-11-26 HISTORY — DX: Presence of aortocoronary bypass graft: Z95.1

## 2014-11-26 LAB — POCT I-STAT, CHEM 8
BUN: 17 mg/dL (ref 6–20)
BUN: 14 mg/dL (ref 6–20)
BUN: 15 mg/dL (ref 6–20)
BUN: 14 mg/dL (ref 6–20)
BUN: 14 mg/dL (ref 6–20)
BUN: 12 mg/dL (ref 6–20)
CALCIUM ION: 1.18 mmol/L (ref 1.12–1.23)
CALCIUM ION: 1.16 mmol/L (ref 1.12–1.23)
CHLORIDE: 97 mmol/L — AB (ref 101–111)
CHLORIDE: 96 mmol/L — AB (ref 101–111)
CHLORIDE: 96 mmol/L — AB (ref 101–111)
CHLORIDE: 103 mmol/L (ref 101–111)
CREATININE: 0.7 mg/dL (ref 0.44–1.00)
Calcium, Ion: 1 mmol/L — ABNORMAL LOW (ref 1.12–1.23)
Calcium, Ion: 1.06 mmol/L — ABNORMAL LOW (ref 1.12–1.23)
Calcium, Ion: 1.12 mmol/L (ref 1.12–1.23)
Calcium, Ion: 1.18 mmol/L (ref 1.12–1.23)
Chloride: 95 mmol/L — ABNORMAL LOW (ref 101–111)
Chloride: 97 mmol/L — ABNORMAL LOW (ref 101–111)
Creatinine, Ser: 0.8 mg/dL (ref 0.44–1.00)
Creatinine, Ser: 0.7 mg/dL (ref 0.44–1.00)
Creatinine, Ser: 0.7 mg/dL (ref 0.44–1.00)
Creatinine, Ser: 0.8 mg/dL (ref 0.44–1.00)
Creatinine, Ser: 0.7 mg/dL (ref 0.44–1.00)
GLUCOSE: 96 mg/dL (ref 65–99)
GLUCOSE: 124 mg/dL — AB (ref 65–99)
Glucose, Bld: 111 mg/dL — ABNORMAL HIGH (ref 65–99)
Glucose, Bld: 170 mg/dL — ABNORMAL HIGH (ref 65–99)
Glucose, Bld: 164 mg/dL — ABNORMAL HIGH (ref 65–99)
Glucose, Bld: 112 mg/dL — ABNORMAL HIGH (ref 65–99)
HCT: 30 % — ABNORMAL LOW (ref 36.0–46.0)
HCT: 23 % — ABNORMAL LOW (ref 36.0–46.0)
HCT: 30 % — ABNORMAL LOW (ref 36.0–46.0)
HEMATOCRIT: 21 % — AB (ref 36.0–46.0)
HEMATOCRIT: 27 % — AB (ref 36.0–46.0)
HEMATOCRIT: 25 % — AB (ref 36.0–46.0)
HEMOGLOBIN: 10.2 g/dL — AB (ref 12.0–15.0)
HEMOGLOBIN: 7.1 g/dL — AB (ref 12.0–15.0)
HEMOGLOBIN: 9.2 g/dL — AB (ref 12.0–15.0)
HEMOGLOBIN: 8.5 g/dL — AB (ref 12.0–15.0)
Hemoglobin: 7.8 g/dL — ABNORMAL LOW (ref 12.0–15.0)
Hemoglobin: 10.2 g/dL — ABNORMAL LOW (ref 12.0–15.0)
POTASSIUM: 5.2 mmol/L — AB (ref 3.5–5.1)
POTASSIUM: 4.5 mmol/L (ref 3.5–5.1)
POTASSIUM: 4.1 mmol/L (ref 3.5–5.1)
POTASSIUM: 4.6 mmol/L (ref 3.5–5.1)
Potassium: 4.1 mmol/L (ref 3.5–5.1)
Potassium: 3.8 mmol/L (ref 3.5–5.1)
SODIUM: 134 mmol/L — AB (ref 135–145)
SODIUM: 134 mmol/L — AB (ref 135–145)
SODIUM: 135 mmol/L (ref 135–145)
SODIUM: 132 mmol/L — AB (ref 135–145)
Sodium: 135 mmol/L (ref 135–145)
Sodium: 134 mmol/L — ABNORMAL LOW (ref 135–145)
TCO2: 31 mmol/L (ref 0–100)
TCO2: 30 mmol/L (ref 0–100)
TCO2: 31 mmol/L (ref 0–100)
TCO2: 31 mmol/L (ref 0–100)
TCO2: 25 mmol/L (ref 0–100)
TCO2: 25 mmol/L (ref 0–100)

## 2014-11-26 LAB — POCT I-STAT 3, ART BLOOD GAS (G3+)
ACID-BASE EXCESS: 7 mmol/L — AB (ref 0.0–2.0)
ACID-BASE EXCESS: 1 mmol/L (ref 0.0–2.0)
Acid-Base Excess: 1 mmol/L (ref 0.0–2.0)
Acid-Base Excess: 1 mmol/L (ref 0.0–2.0)
Acid-base deficit: 1 mmol/L (ref 0.0–2.0)
BICARBONATE: 26.1 meq/L — AB (ref 20.0–24.0)
BICARBONATE: 28.6 meq/L — AB (ref 20.0–24.0)
BICARBONATE: 25.9 meq/L — AB (ref 20.0–24.0)
Bicarbonate: 31.3 mEq/L — ABNORMAL HIGH (ref 20.0–24.0)
Bicarbonate: 26.4 mEq/L — ABNORMAL HIGH (ref 20.0–24.0)
Bicarbonate: 25.7 mEq/L — ABNORMAL HIGH (ref 20.0–24.0)
O2 SAT: 99 %
O2 Saturation: 100 %
O2 Saturation: 97 %
O2 Saturation: 99 %
O2 Saturation: 99 %
O2 Saturation: 99 %
PCO2 ART: 52.6 mmHg — AB (ref 35.0–45.0)
PCO2 ART: 61.3 mmHg — AB (ref 35.0–45.0)
PCO2 ART: 42.6 mmHg (ref 35.0–45.0)
PCO2 ART: 47.3 mmHg — AB (ref 35.0–45.0)
PH ART: 7.458 — AB (ref 7.350–7.450)
PH ART: 7.304 — AB (ref 7.350–7.450)
PH ART: 7.392 (ref 7.350–7.450)
PH ART: 7.357 (ref 7.350–7.450)
PH ART: 7.377 (ref 7.350–7.450)
PO2 ART: 352 mmHg — AB (ref 80.0–100.0)
PO2 ART: 97 mmHg (ref 80.0–100.0)
PO2 ART: 151 mmHg — AB (ref 80.0–100.0)
PO2 ART: 153 mmHg — AB (ref 80.0–100.0)
Patient temperature: 37
Patient temperature: 37
Patient temperature: 37.6
TCO2: 33 mmol/L (ref 0–100)
TCO2: 28 mmol/L (ref 0–100)
TCO2: 30 mmol/L (ref 0–100)
TCO2: 27 mmol/L (ref 0–100)
TCO2: 28 mmol/L (ref 0–100)
TCO2: 27 mmol/L (ref 0–100)
pCO2 arterial: 44.2 mmHg (ref 35.0–45.0)
pCO2 arterial: 44 mmHg (ref 35.0–45.0)
pH, Arterial: 7.277 — ABNORMAL LOW (ref 7.350–7.450)
pO2, Arterial: 164 mmHg — ABNORMAL HIGH (ref 80.0–100.0)
pO2, Arterial: 128 mmHg — ABNORMAL HIGH (ref 80.0–100.0)

## 2014-11-26 LAB — CBC
HEMATOCRIT: 33 % — AB (ref 36.0–46.0)
HEMATOCRIT: 23.9 % — AB (ref 36.0–46.0)
HEMATOCRIT: 31.4 % — AB (ref 36.0–46.0)
HEMOGLOBIN: 10.1 g/dL — AB (ref 12.0–15.0)
HEMOGLOBIN: 10.1 g/dL — AB (ref 12.0–15.0)
Hemoglobin: 7.6 g/dL — ABNORMAL LOW (ref 12.0–15.0)
MCH: 26.5 pg (ref 26.0–34.0)
MCH: 27.4 pg (ref 26.0–34.0)
MCH: 26.9 pg (ref 26.0–34.0)
MCHC: 30.6 g/dL (ref 30.0–36.0)
MCHC: 31.8 g/dL (ref 30.0–36.0)
MCHC: 32.2 g/dL (ref 30.0–36.0)
MCV: 86.6 fL (ref 78.0–100.0)
MCV: 86.3 fL (ref 78.0–100.0)
MCV: 83.7 fL (ref 78.0–100.0)
Platelets: 321 10*3/uL (ref 150–400)
Platelets: 188 10*3/uL (ref 150–400)
Platelets: 206 10*3/uL (ref 150–400)
RBC: 3.81 MIL/uL — ABNORMAL LOW (ref 3.87–5.11)
RBC: 2.77 MIL/uL — ABNORMAL LOW (ref 3.87–5.11)
RBC: 3.75 MIL/uL — ABNORMAL LOW (ref 3.87–5.11)
RDW: 14.8 % (ref 11.5–15.5)
RDW: 14.7 % (ref 11.5–15.5)
RDW: 14.9 % (ref 11.5–15.5)
WBC: 5.5 10*3/uL (ref 4.0–10.5)
WBC: 10.5 10*3/uL (ref 4.0–10.5)
WBC: 10.4 10*3/uL (ref 4.0–10.5)

## 2014-11-26 LAB — GLUCOSE, CAPILLARY
GLUCOSE-CAPILLARY: 111 mg/dL — AB (ref 65–99)
GLUCOSE-CAPILLARY: 145 mg/dL — AB (ref 65–99)
GLUCOSE-CAPILLARY: 88 mg/dL (ref 65–99)
GLUCOSE-CAPILLARY: 97 mg/dL (ref 65–99)
Glucose-Capillary: 110 mg/dL — ABNORMAL HIGH (ref 65–99)
Glucose-Capillary: 106 mg/dL — ABNORMAL HIGH (ref 65–99)
Glucose-Capillary: 116 mg/dL — ABNORMAL HIGH (ref 65–99)
Glucose-Capillary: 126 mg/dL — ABNORMAL HIGH (ref 65–99)

## 2014-11-26 LAB — POCT I-STAT 4, (NA,K, GLUC, HGB,HCT)
GLUCOSE: 102 mg/dL — AB (ref 65–99)
HCT: 30 % — ABNORMAL LOW (ref 36.0–46.0)
HEMOGLOBIN: 10.2 g/dL — AB (ref 12.0–15.0)
Potassium: 4.2 mmol/L (ref 3.5–5.1)
Sodium: 136 mmol/L (ref 135–145)

## 2014-11-26 LAB — URINE CULTURE

## 2014-11-26 LAB — HEMOGLOBIN AND HEMATOCRIT, BLOOD
HEMATOCRIT: 22.2 % — AB (ref 36.0–46.0)
HEMOGLOBIN: 7 g/dL — AB (ref 12.0–15.0)

## 2014-11-26 LAB — PROTIME-INR
INR: 1.21 (ref 0.00–1.49)
Prothrombin Time: 15.5 seconds — ABNORMAL HIGH (ref 11.6–15.2)

## 2014-11-26 LAB — CREATININE, SERUM
CREATININE: 0.69 mg/dL (ref 0.44–1.00)
GFR calc Af Amer: >60 mL/min (ref >60)
GFR calc non Af Amer: >60 mL/min (ref >60)

## 2014-11-26 LAB — PREPARE RBC (CROSSMATCH)

## 2014-11-26 LAB — PLATELET COUNT: Platelets: 221 10*3/uL (ref 150–400)

## 2014-11-26 LAB — APTT: aPTT: 32 seconds (ref 24–37)

## 2014-11-26 LAB — MAGNESIUM: Magnesium: 2.8 mg/dL — ABNORMAL HIGH (ref 1.7–2.4)

## 2014-11-26 SURGERY — CORONARY ARTERY BYPASS GRAFTING (CABG)
Anesthesia: General | Site: Chest

## 2014-11-26 MED ORDER — ALBUMIN HUMAN 5 % IV SOLN
250.0000 mL | INTRAVENOUS | Status: AC | PRN
  Administered 2014-11-26 (×3): 250 mL via INTRAVENOUS
  Filled 2014-11-26 (×2): qty 250

## 2014-11-26 MED ORDER — MIDAZOLAM HCL 5 MG/5ML IJ SOLN
INTRAMUSCULAR | Status: DC | PRN
  Administered 2014-11-26 (×6): 2 mg via INTRAVENOUS
  Administered 2014-11-26: 1 mg via INTRAVENOUS
  Administered 2014-11-26: 2 mg via INTRAVENOUS
  Administered 2014-11-26: 1 mg via INTRAVENOUS

## 2014-11-26 MED ORDER — MIDAZOLAM HCL 2 MG/2ML IJ SOLN
INTRAMUSCULAR | Status: AC
  Filled 2014-11-26: qty 4

## 2014-11-26 MED ORDER — VANCOMYCIN HCL IN DEXTROSE 1-5 GM/200ML-% IV SOLN
1000.0000 mg | Freq: Once | INTRAVENOUS | Status: AC
Start: 2014-11-26 — End: 2014-11-26
  Administered 2014-11-26: 1000 mg via INTRAVENOUS
  Filled 2014-11-26: qty 200

## 2014-11-26 MED ORDER — PANTOPRAZOLE SODIUM 40 MG PO TBEC: 40.0000 mg | DELAYED_RELEASE_TABLET | Freq: Every day | ORAL | Status: DC

## 2014-11-26 MED ORDER — ONDANSETRON HCL 4 MG/2ML IJ SOLN
4.0000 mg | Freq: Four times a day (QID) | INTRAMUSCULAR | Status: DC | PRN
  Administered 2014-11-29 – 2014-12-01 (×5): 4 mg via INTRAVENOUS
  Filled 2014-11-26 (×5): qty 2

## 2014-11-26 MED ORDER — ROCURONIUM BROMIDE 50 MG/5ML IV SOLN
INTRAVENOUS | Status: AC
  Filled 2014-11-26: qty 3

## 2014-11-26 MED ORDER — ANTISEPTIC ORAL RINSE SOLUTION (CORINZ)
7.0000 mL | Freq: Four times a day (QID) | OROMUCOSAL | Status: DC
  Administered 2014-11-27 – 2014-11-28 (×5): 7 mL via OROMUCOSAL

## 2014-11-26 MED ORDER — 0.9 % SODIUM CHLORIDE (POUR BTL) OPTIME
TOPICAL | Status: DC | PRN
  Administered 2014-11-26: 6000 mL

## 2014-11-26 MED ORDER — SUFENTANIL CITRATE 250 MCG/5ML IV SOLN
INTRAVENOUS | Status: AC
  Filled 2014-11-26: qty 5

## 2014-11-26 MED ORDER — DEXTROSE 5 % IV SOLN
1.5000 g | Freq: Two times a day (BID) | INTRAVENOUS | Status: AC
  Administered 2014-11-26 – 2014-11-28 (×4): 1.5 g via INTRAVENOUS
  Filled 2014-11-26 (×4): qty 1.5

## 2014-11-26 MED ORDER — ARTIFICIAL TEARS OP OINT
TOPICAL_OINTMENT | OPHTHALMIC | Status: DC | PRN
  Administered 2014-11-26: 1 via OPHTHALMIC

## 2014-11-26 MED ORDER — LEVALBUTEROL HCL 0.63 MG/3ML IN NEBU
0.6300 mg | INHALATION_SOLUTION | Freq: Four times a day (QID) | RESPIRATORY_TRACT | Status: DC
  Administered 2014-11-26 – 2014-11-27 (×4): 0.63 mg via RESPIRATORY_TRACT
  Filled 2014-11-26 (×7): qty 3

## 2014-11-26 MED ORDER — METOPROLOL TARTRATE 25 MG/10 ML ORAL SUSPENSION
12.5000 mg | Freq: Two times a day (BID) | ORAL | Status: DC
  Filled 2014-11-26 (×3): qty 5

## 2014-11-26 MED ORDER — VANCOMYCIN HCL 1000 MG IV SOLR
INTRAVENOUS | Status: DC | PRN
  Administered 2014-11-26: 1000 mL

## 2014-11-26 MED ORDER — ROCURONIUM BROMIDE 50 MG/5ML IV SOLN
INTRAVENOUS | Status: AC
  Filled 2014-11-26: qty 1

## 2014-11-26 MED ORDER — PHENYLEPHRINE HCL 10 MG/ML IJ SOLN
INTRAMUSCULAR | Status: AC
  Filled 2014-11-26: qty 1

## 2014-11-26 MED ORDER — ACETAMINOPHEN 650 MG RE SUPP
650.0000 mg | Freq: Once | RECTAL | Status: AC
  Administered 2014-11-26: 650 mg via RECTAL

## 2014-11-26 MED ORDER — VITAMIN D (ERGOCALCIFEROL) 1.25 MG (50000 UNIT) PO CAPS: 50000.0000 [IU] | ORAL_CAPSULE | ORAL | Status: AC

## 2014-11-26 MED ORDER — FENTANYL CITRATE (PF) 250 MCG/5ML IJ SOLN
INTRAMUSCULAR | Status: AC
  Filled 2014-11-26: qty 5

## 2014-11-26 MED ORDER — POTASSIUM CHLORIDE 10 MEQ/50ML IV SOLN: 10.0000 meq | INTRAVENOUS | Status: AC

## 2014-11-26 MED ORDER — PHENYLEPHRINE HCL 10 MG/ML IJ SOLN
0.0000 ug/min | INTRAVENOUS | Status: DC
  Administered 2014-11-26 (×2): 70 ug/min via INTRAVENOUS
  Filled 2014-11-26 (×3): qty 2

## 2014-11-26 MED ORDER — ASPIRIN EC 325 MG PO TBEC
325.0000 mg | DELAYED_RELEASE_TABLET | Freq: Every day | ORAL | Status: DC
  Administered 2014-11-27 – 2014-12-02 (×6): 325 mg via ORAL
  Filled 2014-11-26 (×6): qty 1

## 2014-11-26 MED ORDER — BISACODYL 10 MG RE SUPP: 10.0000 mg | Freq: Every day | RECTAL | Status: DC

## 2014-11-26 MED ORDER — SODIUM CHLORIDE 0.45 % IV SOLN: INTRAVENOUS | Status: DC | PRN

## 2014-11-26 MED ORDER — LACTATED RINGERS IV SOLN
INTRAVENOUS | Status: DC | PRN
  Administered 2014-11-26 (×3): via INTRAVENOUS

## 2014-11-26 MED ORDER — SODIUM CHLORIDE 0.9 % IJ SOLN
OROMUCOSAL | Status: DC | PRN
  Administered 2014-11-26 (×3): via TOPICAL

## 2014-11-26 MED ORDER — ASPIRIN 81 MG PO CHEW: 324.0000 mg | CHEWABLE_TABLET | Freq: Every day | ORAL | Status: DC

## 2014-11-26 MED ORDER — SODIUM CHLORIDE 0.9 % IV SOLN
INTRAVENOUS | Status: DC
  Administered 2014-11-26: 1.3 [IU]/h via INTRAVENOUS
  Administered 2014-11-26: 3.4 [IU]/h via INTRAVENOUS
  Filled 2014-11-26 (×2): qty 2.5

## 2014-11-26 MED ORDER — LEVALBUTEROL HCL 0.63 MG/3ML IN NEBU
INHALATION_SOLUTION | RESPIRATORY_TRACT | Status: AC
  Filled 2014-11-26: qty 3

## 2014-11-26 MED ORDER — SODIUM CHLORIDE 0.9 % IV SOLN
INTRAVENOUS | Status: DC
  Administered 2014-11-26: 15:00:00 via INTRAVENOUS

## 2014-11-26 MED ORDER — LACTATED RINGERS IV SOLN: 500.0000 mL | Freq: Once | INTRAVENOUS | Status: DC | PRN

## 2014-11-26 MED ORDER — MORPHINE SULFATE (PF) 2 MG/ML IV SOLN: 1.0000 mg | INTRAVENOUS | Status: DC | PRN

## 2014-11-26 MED ORDER — METOPROLOL TARTRATE 1 MG/ML IV SOLN: 2.5000 mg | INTRAVENOUS | Status: DC | PRN

## 2014-11-26 MED ORDER — SODIUM CHLORIDE 0.9 % IJ SOLN
INTRAMUSCULAR | Status: AC
  Filled 2014-11-26: qty 20

## 2014-11-26 MED ORDER — SODIUM CHLORIDE 0.9 % IJ SOLN: 3.0000 mL | INTRAMUSCULAR | Status: DC | PRN

## 2014-11-26 MED ORDER — SODIUM CHLORIDE 0.9 % IV SOLN
INTRAVENOUS | Status: DC | PRN
  Administered 2014-11-26: 13:00:00 via INTRAVENOUS

## 2014-11-26 MED ORDER — ROCURONIUM BROMIDE 100 MG/10ML IV SOLN
INTRAVENOUS | Status: DC | PRN
  Administered 2014-11-26: 100 mg via INTRAVENOUS
  Administered 2014-11-26 (×4): 50 mg via INTRAVENOUS

## 2014-11-26 MED ORDER — HEPARIN SODIUM (PORCINE) 1000 UNIT/ML IJ SOLN
INTRAMUSCULAR | Status: AC
  Filled 2014-11-26: qty 1

## 2014-11-26 MED ORDER — MAGNESIUM SULFATE 4 GM/100ML IV SOLN
4.0000 g | Freq: Once | INTRAVENOUS | Status: AC
  Administered 2014-11-26: 4 g via INTRAVENOUS
  Filled 2014-11-26: qty 100

## 2014-11-26 MED ORDER — LACTATED RINGERS IV SOLN
INTRAVENOUS | Status: DC
  Administered 2014-11-26: 15:00:00 via INTRAVENOUS

## 2014-11-26 MED ORDER — PROPOFOL 10 MG/ML IV BOLUS
INTRAVENOUS | Status: DC | PRN
  Administered 2014-11-26: 100 mg via INTRAVENOUS

## 2014-11-26 MED ORDER — MIDAZOLAM HCL 10 MG/2ML IJ SOLN
INTRAMUSCULAR | Status: AC
  Filled 2014-11-26: qty 4

## 2014-11-26 MED ORDER — HEPARIN SODIUM (PORCINE) 1000 UNIT/ML IJ SOLN
INTRAMUSCULAR | Status: DC | PRN
  Administered 2014-11-26: 35000 [IU] via INTRAVENOUS
  Administered 2014-11-26: 3000 [IU] via INTRAVENOUS

## 2014-11-26 MED ORDER — SODIUM CHLORIDE 0.9 % IJ SOLN
3.0000 mL | Freq: Two times a day (BID) | INTRAMUSCULAR | Status: DC
Start: 2014-11-27 — End: 2014-12-02
  Administered 2014-11-27 – 2014-12-01 (×6): 3 mL via INTRAVENOUS

## 2014-11-26 MED ORDER — BISACODYL 5 MG PO TBEC
10.0000 mg | DELAYED_RELEASE_TABLET | Freq: Every day | ORAL | Status: DC
  Administered 2014-11-27 – 2014-11-30 (×4): 10 mg via ORAL
  Filled 2014-11-26 (×5): qty 2

## 2014-11-26 MED ORDER — NITROGLYCERIN IN D5W 200-5 MCG/ML-% IV SOLN
0.0000 ug/min | INTRAVENOUS | Status: DC
  Administered 2014-11-27: 20 ug/min via INTRAVENOUS
  Filled 2014-11-26: qty 250

## 2014-11-26 MED ORDER — FAMOTIDINE IN NACL 20-0.9 MG/50ML-% IV SOLN
20.0000 mg | Freq: Two times a day (BID) | INTRAVENOUS | Status: DC
  Administered 2014-11-26: 20 mg via INTRAVENOUS

## 2014-11-26 MED ORDER — EPHEDRINE SULFATE 50 MG/ML IJ SOLN
INTRAMUSCULAR | Status: AC
  Filled 2014-11-26: qty 1

## 2014-11-26 MED ORDER — METOPROLOL TARTRATE 12.5 MG HALF TABLET
12.5000 mg | ORAL_TABLET | Freq: Two times a day (BID) | ORAL | Status: DC
  Filled 2014-11-26 (×3): qty 1

## 2014-11-26 MED ORDER — SUFENTANIL CITRATE 50 MCG/ML IV SOLN
INTRAVENOUS | Status: DC | PRN
  Administered 2014-11-26 (×5): 10 ug via INTRAVENOUS

## 2014-11-26 MED ORDER — DEXMEDETOMIDINE HCL IN NACL 200 MCG/50ML IV SOLN
0.0000 ug/kg/h | INTRAVENOUS | Status: DC
  Administered 2014-11-26: 0.7 ug/kg/h via INTRAVENOUS
  Filled 2014-11-26 (×2): qty 50

## 2014-11-26 MED ORDER — INSULIN REGULAR BOLUS VIA INFUSION
0.0000 [IU] | Freq: Three times a day (TID) | INTRAVENOUS | Status: DC
  Filled 2014-11-26: qty 10

## 2014-11-26 MED ORDER — DOCUSATE SODIUM 100 MG PO CAPS
200.0000 mg | ORAL_CAPSULE | Freq: Every day | ORAL | Status: DC
  Administered 2014-11-27 – 2014-11-30 (×4): 200 mg via ORAL
  Filled 2014-11-26 (×5): qty 2

## 2014-11-26 MED ORDER — PHENYLEPHRINE 40 MCG/ML (10ML) SYRINGE FOR IV PUSH (FOR BLOOD PRESSURE SUPPORT)
PREFILLED_SYRINGE | INTRAVENOUS | Status: AC
  Filled 2014-11-26: qty 10

## 2014-11-26 MED ORDER — SODIUM CHLORIDE 0.9 % IV SOLN
Freq: Once | INTRAVENOUS | Status: AC
Start: 2014-11-26 — End: 2014-11-26
  Administered 2014-11-26: 17:00:00 via INTRAVENOUS

## 2014-11-26 MED ORDER — SODIUM CHLORIDE 0.9 % IV SOLN: 250.0000 mL | INTRAVENOUS | Status: DC

## 2014-11-26 MED ORDER — PROTAMINE SULFATE 10 MG/ML IV SOLN
INTRAVENOUS | Status: DC | PRN
  Administered 2014-11-26: 25 mg via INTRAVENOUS
  Administered 2014-11-26: 20 mg via INTRAVENOUS
  Administered 2014-11-26 (×2): 50 mg via INTRAVENOUS
  Administered 2014-11-26: 30 mg via INTRAVENOUS
  Administered 2014-11-26 (×4): 50 mg via INTRAVENOUS
  Administered 2014-11-26: 25 mg via INTRAVENOUS

## 2014-11-26 MED ORDER — ACETAMINOPHEN 500 MG PO TABS
1000.0000 mg | ORAL_TABLET | Freq: Four times a day (QID) | ORAL | Status: AC
  Administered 2014-11-26 – 2014-12-01 (×16): 1000 mg via ORAL
  Filled 2014-11-26 (×18): qty 2

## 2014-11-26 MED ORDER — PROTAMINE SULFATE 10 MG/ML IV SOLN
INTRAVENOUS | Status: AC
  Filled 2014-11-26: qty 10

## 2014-11-26 MED ORDER — OXYCODONE HCL 5 MG PO TABS
5.0000 mg | ORAL_TABLET | ORAL | Status: DC | PRN
  Administered 2014-11-26 – 2014-12-01 (×23): 10 mg via ORAL
  Administered 2014-12-01: 5 mg via ORAL
  Administered 2014-12-01 – 2014-12-02 (×3): 10 mg via ORAL
  Filled 2014-11-26 (×28): qty 2

## 2014-11-26 MED ORDER — ACETAMINOPHEN 160 MG/5ML PO SOLN: 650.0000 mg | Freq: Once | ORAL | Status: AC

## 2014-11-26 MED ORDER — MIDAZOLAM HCL 2 MG/2ML IJ SOLN
2.0000 mg | INTRAMUSCULAR | Status: DC | PRN
  Administered 2014-11-26: 2 mg via INTRAVENOUS
  Filled 2014-11-26: qty 2

## 2014-11-26 MED ORDER — INSULIN ASPART 100 UNIT/ML ~~LOC~~ SOLN
0.0000 [IU] | SUBCUTANEOUS | Status: DC
  Administered 2014-11-27: 2 [IU] via SUBCUTANEOUS
  Administered 2014-11-27: 4 [IU] via SUBCUTANEOUS
  Administered 2014-11-27 – 2014-11-28 (×2): 2 [IU] via SUBCUTANEOUS

## 2014-11-26 MED ORDER — PROPOFOL 10 MG/ML IV BOLUS
INTRAVENOUS | Status: AC
  Filled 2014-11-26: qty 20

## 2014-11-26 MED ORDER — ACETAMINOPHEN 160 MG/5ML PO SOLN: 1000.0000 mg | Freq: Four times a day (QID) | ORAL | Status: DC

## 2014-11-26 MED ORDER — PLASMA-LYTE 148 IV SOLN
INTRAVENOUS | Status: DC | PRN
  Administered 2014-11-26: 500 mL via INTRAVASCULAR

## 2014-11-26 MED ORDER — FENTANYL CITRATE (PF) 100 MCG/2ML IJ SOLN
INTRAMUSCULAR | Status: DC | PRN
  Administered 2014-11-26: 400 ug via INTRAVENOUS
  Administered 2014-11-26: 250 ug via INTRAVENOUS
  Administered 2014-11-26: 50 ug via INTRAVENOUS
  Administered 2014-11-26: 250 ug via INTRAVENOUS
  Administered 2014-11-26: 50 ug via INTRAVENOUS
  Administered 2014-11-26: 250 ug via INTRAVENOUS

## 2014-11-26 MED ORDER — TRAMADOL HCL 50 MG PO TABS
50.0000 mg | ORAL_TABLET | ORAL | Status: DC | PRN
  Administered 2014-11-29: 100 mg via ORAL
  Filled 2014-11-26: qty 2

## 2014-11-26 MED ORDER — SUFENTANIL CITRATE 50 MCG/ML IV SOLN
INTRAVENOUS | Status: AC
  Filled 2014-11-26: qty 1

## 2014-11-26 MED ORDER — CHLORHEXIDINE GLUCONATE 0.12% ORAL RINSE (MEDLINE KIT)
15.0000 mL | Freq: Two times a day (BID) | OROMUCOSAL | Status: DC
  Administered 2014-11-26 – 2014-12-01 (×2): 15 mL via OROMUCOSAL

## 2014-11-26 MED ORDER — MORPHINE SULFATE (PF) 2 MG/ML IV SOLN
2.0000 mg | INTRAVENOUS | Status: DC | PRN
  Administered 2014-11-26 – 2014-11-27 (×6): 4 mg via INTRAVENOUS
  Filled 2014-11-26 (×6): qty 2

## 2014-11-26 MED FILL — Lidocaine HCl IV Inj 20 MG/ML: INTRAVENOUS | Qty: 5 | Status: AC

## 2014-11-26 MED FILL — Sodium Bicarbonate IV Soln 8.4%: INTRAVENOUS | Qty: 50 | Status: AC

## 2014-11-26 MED FILL — Sodium Chloride IV Soln 0.9%: INTRAVENOUS | Qty: 2000 | Status: AC

## 2014-11-26 MED FILL — Electrolyte-R (PH 7.4) Solution: INTRAVENOUS | Qty: 4000 | Status: AC

## 2014-11-26 MED FILL — Mannitol IV Soln 20%: INTRAVENOUS | Qty: 500 | Status: AC

## 2014-11-26 MED FILL — Heparin Sodium (Porcine) Inj 1000 Unit/ML: INTRAMUSCULAR | Qty: 10 | Status: AC

## 2014-11-26 SURGICAL SUPPLY — 112 items
APPLIER CLIP 9.375 SM OPEN (CLIP) ×6
BAG DECANTER FOR FLEXI CONT (MISCELLANEOUS) ×6 IMPLANT
BANDAGE ELASTIC 4 VELCRO ST LF (GAUZE/BANDAGES/DRESSINGS) ×3 IMPLANT
BANDAGE ELASTIC 6 VELCRO ST LF (GAUZE/BANDAGES/DRESSINGS) ×3 IMPLANT
BASKET HEART (ORDER IN 25'S) (MISCELLANEOUS) ×1
BASKET HEART (ORDER IN 25S) (MISCELLANEOUS) ×2 IMPLANT
BLADE STERNUM SYSTEM 6 (BLADE) ×3 IMPLANT
BLADE SURG 11 STRL SS (BLADE) ×3 IMPLANT
BLADE SURG ROTATE 9660 (MISCELLANEOUS) IMPLANT
BNDG GAUZE ELAST 4 BULKY (GAUZE/BANDAGES/DRESSINGS) ×3 IMPLANT
CANISTER SUCTION 2500CC (MISCELLANEOUS) ×3 IMPLANT
CANNULA EZ GLIDE AORTIC 21FR (CANNULA) ×3 IMPLANT
CATH CPB KIT OWEN (MISCELLANEOUS) ×3 IMPLANT
CATH THORACIC 36FR (CATHETERS) ×3 IMPLANT
CLIP APPLIE 9.375 SM OPEN (CLIP) ×4 IMPLANT
CLIP TI MEDIUM 24 (CLIP) IMPLANT
CLIP TI WIDE RED SMALL 24 (CLIP) IMPLANT
CRADLE DONUT ADULT HEAD (MISCELLANEOUS) ×3 IMPLANT
DRAIN CHANNEL 32F RND 10.7 FF (WOUND CARE) ×6 IMPLANT
DRAPE CARDIOVASCULAR INCISE (DRAPES) ×1
DRAPE INCISE IOBAN 66X45 STRL (DRAPES) IMPLANT
DRAPE SLUSH/WARMER DISC (DRAPES) ×3 IMPLANT
DRAPE SRG 135X102X78XABS (DRAPES) ×2 IMPLANT
DRSG AQUACEL AG ADV 3.5X14 (GAUZE/BANDAGES/DRESSINGS) ×3 IMPLANT
DRSG COVADERM 4X14 (GAUZE/BANDAGES/DRESSINGS) IMPLANT
ELECT BLADE 4.0 EZ CLEAN MEGAD (MISCELLANEOUS) ×3
ELECT REM PT RETURN 9FT ADLT (ELECTROSURGICAL) ×6
ELECTRODE BLDE 4.0 EZ CLN MEGD (MISCELLANEOUS) ×2 IMPLANT
ELECTRODE REM PT RTRN 9FT ADLT (ELECTROSURGICAL) ×4 IMPLANT
GAUZE SPONGE 4X4 12PLY STRL (GAUZE/BANDAGES/DRESSINGS) ×6 IMPLANT
GLOVE BIO SURGEON STRL SZ 6 (GLOVE) ×6 IMPLANT
GLOVE BIO SURGEON STRL SZ 6.5 (GLOVE) ×12 IMPLANT
GLOVE BIOGEL PI IND STRL 6 (GLOVE) ×6 IMPLANT
GLOVE BIOGEL PI IND STRL 6.5 (GLOVE) ×6 IMPLANT
GLOVE BIOGEL PI INDICATOR 6 (GLOVE) ×3
GLOVE BIOGEL PI INDICATOR 6.5 (GLOVE) ×3
GLOVE ORTHO TXT STRL SZ7.5 (GLOVE) ×6 IMPLANT
GOWN STRL REUS W/ TWL LRG LVL3 (GOWN DISPOSABLE) ×16 IMPLANT
GOWN STRL REUS W/TWL LRG LVL3 (GOWN DISPOSABLE) ×8
HEMOSTAT POWDER SURGIFOAM 1G (HEMOSTASIS) ×9 IMPLANT
INSERT FOGARTY XLG (MISCELLANEOUS) ×3 IMPLANT
KIT BASIN OR (CUSTOM PROCEDURE TRAY) ×3 IMPLANT
KIT ROOM TURNOVER OR (KITS) ×3 IMPLANT
KIT SUCTION CATH 14FR (SUCTIONS) ×9 IMPLANT
KIT VASOVIEW ACCESSORY VH 2004 (KITS) ×3 IMPLANT
KIT VASOVIEW W/TROCAR VH 2000 (KITS) ×3 IMPLANT
LEAD PACING MYOCARDI (MISCELLANEOUS) ×3 IMPLANT
MARKER GRAFT CORONARY BYPASS (MISCELLANEOUS) ×9 IMPLANT
NS IRRIG 1000ML POUR BTL (IV SOLUTION) ×18 IMPLANT
PACK OPEN HEART (CUSTOM PROCEDURE TRAY) ×3 IMPLANT
PAD ARMBOARD 7.5X6 YLW CONV (MISCELLANEOUS) ×6 IMPLANT
PAD ELECT DEFIB RADIOL ZOLL (MISCELLANEOUS) ×3 IMPLANT
PENCIL BUTTON HOLSTER BLD 10FT (ELECTRODE) ×3 IMPLANT
PUNCH AORTIC ROTATE 4.0MM (MISCELLANEOUS) IMPLANT
PUNCH AORTIC ROTATE 4.5MM 8IN (MISCELLANEOUS) ×3 IMPLANT
PUNCH AORTIC ROTATE 5MM 8IN (MISCELLANEOUS) IMPLANT
SET CARDIOPLEGIA MPS 5001102 (MISCELLANEOUS) ×3 IMPLANT
SOLUTION ANTI FOG 6CC (MISCELLANEOUS) ×3 IMPLANT
SPONGE GAUZE 4X4 12PLY STER LF (GAUZE/BANDAGES/DRESSINGS) ×6 IMPLANT
SPONGE LAP 18X18 X RAY DECT (DISPOSABLE) ×6 IMPLANT
SPONGE LAP 4X18 X RAY DECT (DISPOSABLE) ×3 IMPLANT
SUT BONE WAX W31G (SUTURE) ×3 IMPLANT
SUT ETHIBOND 2 0 SH (SUTURE) ×4
SUT ETHIBOND 2 0 SH 36X2 (SUTURE) ×8 IMPLANT
SUT ETHIBOND X763 2 0 SH 1 (SUTURE) ×6 IMPLANT
SUT MNCRL AB 3-0 PS2 18 (SUTURE) ×6 IMPLANT
SUT MNCRL AB 4-0 PS2 18 (SUTURE) ×3 IMPLANT
SUT PDS AB 1 CTX 36 (SUTURE) ×6 IMPLANT
SUT PROLENE 2 0 SH DA (SUTURE) IMPLANT
SUT PROLENE 3 0 SH DA (SUTURE) ×3 IMPLANT
SUT PROLENE 3 0 SH1 36 (SUTURE) IMPLANT
SUT PROLENE 4 0 RB 1 (SUTURE) ×2
SUT PROLENE 4 0 SH DA (SUTURE) IMPLANT
SUT PROLENE 4-0 RB1 .5 CRCL 36 (SUTURE) ×4 IMPLANT
SUT PROLENE 5 0 C 1 36 (SUTURE) ×6 IMPLANT
SUT PROLENE 6 0 C 1 30 (SUTURE) ×12 IMPLANT
SUT PROLENE 7.0 RB 3 (SUTURE) ×27 IMPLANT
SUT PROLENE 8 0 BV175 6 (SUTURE) ×15 IMPLANT
SUT PROLENE BLUE 7 0 (SUTURE) ×9 IMPLANT
SUT PROLENE POLY MONO (SUTURE) IMPLANT
SUT SILK  1 MH (SUTURE) ×4
SUT SILK 1 MH (SUTURE) ×8 IMPLANT
SUT SILK 1 TIES 10X30 (SUTURE) ×3 IMPLANT
SUT SILK 2 0 SH CR/8 (SUTURE) ×6 IMPLANT
SUT SILK 2 0 TIES 10X30 (SUTURE) ×3 IMPLANT
SUT SILK 2 0 TIES 17X18 (SUTURE) ×1
SUT SILK 2-0 18XBRD TIE BLK (SUTURE) ×2 IMPLANT
SUT SILK 3 0 SH CR/8 (SUTURE) ×3 IMPLANT
SUT SILK 4 0 TIE 10X30 (SUTURE) ×6 IMPLANT
SUT STEEL 6MS V (SUTURE) IMPLANT
SUT STEEL STERNAL CCS#1 18IN (SUTURE) IMPLANT
SUT STEEL SZ 6 DBL 3X14 BALL (SUTURE) IMPLANT
SUT TEM PAC WIRE 2 0 SH (SUTURE) ×6 IMPLANT
SUT VIC AB 1 CTX 36 (SUTURE)
SUT VIC AB 1 CTX36XBRD ANBCTR (SUTURE) IMPLANT
SUT VIC AB 2-0 CT1 27 (SUTURE) ×1
SUT VIC AB 2-0 CT1 36 (SUTURE) ×9 IMPLANT
SUT VIC AB 2-0 CT1 TAPERPNT 27 (SUTURE) ×2 IMPLANT
SUT VIC AB 2-0 CTX 27 (SUTURE) ×6 IMPLANT
SUT VIC AB 3-0 SH 27 (SUTURE)
SUT VIC AB 3-0 SH 27X BRD (SUTURE) IMPLANT
SUT VIC AB 3-0 X1 27 (SUTURE) IMPLANT
SUT VICRYL 4-0 PS2 18IN ABS (SUTURE) ×3 IMPLANT
SUTURE E-PAK OPEN HEART (SUTURE) ×3 IMPLANT
SYSTEM SAHARA CHEST DRAIN ATS (WOUND CARE) ×3 IMPLANT
TAPE CLOTH SURG 4X10 WHT LF (GAUZE/BANDAGES/DRESSINGS) ×6 IMPLANT
TOWEL OR 17X24 6PK STRL BLUE (TOWEL DISPOSABLE) ×6 IMPLANT
TOWEL OR 17X26 10 PK STRL BLUE (TOWEL DISPOSABLE) ×6 IMPLANT
TRAY FOLEY IC TEMP SENS 16FR (CATHETERS) ×3 IMPLANT
TUBING INSUFFLATION (TUBING) ×3 IMPLANT
UNDERPAD 30X30 INCONTINENT (UNDERPADS AND DIAPERS) ×3 IMPLANT
WATER STERILE IRR 1000ML POUR (IV SOLUTION) ×6 IMPLANT

## 2014-11-26 NOTE — Progress Notes (Signed)
CT surgery p.m. Rounds  Status post multivessel CABG Receiving packed cells for postop hemoglobin 7.3 No significant drainage from chest tubes Pulmonary status stable, patient being wean from ventilator

## 2014-11-26 NOTE — Anesthesia Preprocedure Evaluation (Addendum)
Anesthesia Evaluation  Patient identified by MRN, date of birth, ID band Patient awake    Reviewed: Allergy & Precautions, NPO status , Patient's Chart, lab work & pertinent test results  Airway Mallampati: I  TM Distance: >3 FB Neck ROM: Full    Dental  (+) Edentulous Upper, Edentulous Lower, Dental Advisory Given   Pulmonary asthma , sleep apnea and Continuous Positive Airway Pressure Ventilation , Current Smoker,    breath sounds clear to auscultation       Cardiovascular hypertension, Pt. on home beta blockers and Pt. on medications + angina + CAD   Rhythm:Regular Rate:Normal     Neuro/Psych Seizures -,  PSYCHIATRIC DISORDERS Anxiety Depression CVA, Residual Symptoms    GI/Hepatic hiatal hernia, GERD  ,  Endo/Other  diabetes, GestationalHypothyroidism   Renal/GU      Musculoskeletal  (+) Fibromyalgia -  Abdominal   Peds  Hematology   Anesthesia Other Findings   Reproductive/Obstetrics                           Anesthesia Physical Anesthesia Plan  ASA: IV  Anesthesia Plan: General   Post-op Pain Management:    Induction: Intravenous  Airway Management Planned: Oral ETT  Additional Equipment: Arterial line, PA Cath and 3D TEE  Intra-op Plan:   Post-operative Plan: Post-operative intubation/ventilation  Informed Consent:   Plan Discussed with:   Anesthesia Plan Comments:         Anesthesia Quick Evaluation

## 2014-11-26 NOTE — Transfer of Care (Signed)
Immediate Anesthesia Transfer of Care Note  Patient: Loreli Debruler  Procedure(s) Performed: Procedure(s): CORONARY ARTERY BYPASS GRAFTING (CABG) TIMES FOUR USING LEFT INTERNAL MAMMARY ARTERY AND RIGHT SAPHENOUS LEG VEIN HARVESTED ENDOSCOPICALLY (N/A) TRANSESOPHAGEAL ECHOCARDIOGRAM (TEE) (N/A)  Patient Location: ICU  Anesthesia Type:General  Level of Consciousness: Patient remains intubated per anesthesia plan  Airway & Oxygen Therapy: Patient remains intubated per anesthesia plan and Patient placed on Ventilator (see vital sign flow sheet for setting)  Post-op Assessment: Report given to RN  Post vital signs: Reviewed and stable  Last Vitals:  Filed Vitals:   11/26/14 0838  BP:   Pulse:   Temp:   Resp: 15    Complications: No apparent anesthesia complications

## 2014-11-26 NOTE — Progress Notes (Signed)
First attempt to wean, too sleepy, continue to wean when tolerated. Placed back to a rate 12.

## 2014-11-26 NOTE — Op Note (Signed)
CARDIOTHORACIC SURGERY OPERATIVE NOTE  Date of Procedure: 11/26/2014  Preoperative Diagnosis:   Severe 3-vessel Coronary Artery Disease  Unstable Angina Pectoris  Postoperative Diagnosis: Same  Procedure:    Coronary Artery Bypass Grafting x 4   Left Internal Mammary Artery to Distal Left Anterior Descending Coronary Artery  Saphenous Vein Graft to Posterior Descending Coronary Artery  Saphenous Vein Graft to Obtuse Marginal Branch of Left Circumflex Coronary Artery  Sapheonous Vein Graft to Diagonal Branch Coronary Artery  Endoscopic Vein Harvest from Right Thigh and Lower Leg  Surgeon: Valentina Gu. Roxy Manns, MD  Assistant: Nani Skillern, PA-C  Anesthesia: Leticia Penna, MD  Operative Findings:  Low-normal LV systolic function with mild LVH, EF estimated 45-50%  Diffuse CAD with fair quality target vessels for grafting  Small caliber but otherwise good quality LIMA conduit for grafting  Good quality SVG conduit for grafting     BRIEF CLINICAL NOTE AND INDICATIONS FOR SURGERY  Patient is a 39 year old obese female with no previous history of coronary artery disease but risk factors notable for history of long-standing tobacco abuse, hyperlipidemia, and polysubstance abuse this been referred for surgical consultation to discuss treatment options for management of multivessel coronary artery disease. She reportedly has a long-standing history of palpitations and atypical chest pain. She has been evaluated by a cardiologist in the past in [place] and treated with Klonopin for anxiety. The patient previously lived in [place] and moved to [place] within the past year. She was recently referred for cardiology consultation and evaluated by Dr. Stanford Breed on 10/30/2014. She describes atypical symptoms of chest discomfort and shortness of breath that occur both with activity and at rest. She underwent a transthoracic echocardiogram 11/12/2014 that revealed normal left  ventricular size and systolic function with ejection fraction estimated 62-26%, normal diastolic function, and no significant regional wall motion abnormalities. Stress myoview exam was abnormal and felt to be intermediate risk with resting ejection fraction estimated 51% and findings consistent with anterolateral, anterior, and apical ischemia. The patient underwent elective diagnostic cardiac catheterization by Dr. Ellyn Hack demonstrating the presence of severe three-vessel coronary artery disease with chronic total occlusion of left circumflex coronary artery and the distal right coronary artery. There was moderate stenosis of the left anterior descending coronary artery. Cardiothoracic surgical consultation was requested. The patient has been seen in consultation and counseled at length regarding the indications, risks and potential benefits of surgery.  All questions have been answered, and the patient provides full informed consent for the operation as described.     DETAILS OF THE OPERATIVE PROCEDURE  Preparation:  The patient is brought to the operating room on the above mentioned date and central monitoring was established by the anesthesia team including placement of Swan-Ganz catheter and radial arterial line. The patient is placed in the supine position on the operating table.  Intravenous antibiotics are administered. General endotracheal anesthesia is induced uneventfully. A Foley catheter is placed.  Baseline transesophageal echocardiogram was performed.  Findings were notable for low normal LV systolic function with ejection fraction estimated 45-50%.  There was mild LVH.  The patient's chest, abdomen, both groins, and both lower extremities are prepared and draped in a sterile manner. A time out procedure is performed.   Surgical Approach and Conduit Harvest:  A median sternotomy incision was performed and the left internal mammary artery is dissected from the chest wall and prepared  for bypass grafting. The left internal mammary artery is notably small caliber but otherwise good quality  conduit. Simultaneously, the greater saphenous vein is obtained from the patient's right thigh and lower leg using endoscopic vein harvest technique. The saphenous vein is notably good quality conduit. After removal of the saphenous vein, the small surgical incisions in the lower extremity are closed with absorbable suture. Following systemic heparinization, the left internal mammary artery was transected distally noted to have excellent flow.   Extracorporeal Cardiopulmonary Bypass and Myocardial Protection:  The pericardium is opened. The ascending aorta is normal in appearance. The ascending aorta and the right atrium are cannulated for cardiopulmonary bypass.  Adequate heparinization is verified.     The entire pre-bypass portion of the operation was notable for stable hemodynamics.  Cardiopulmonary bypass was begun and the surface of the heart is inspected. Distal target vessels are selected for coronary artery bypass grafting. A cardioplegia cannula is placed in the ascending aorta.  A temperature probe was placed in the interventricular septum.  The patient is allowed to cool passively to [facility] systemic temperature.  The aortic cross clamp is applied and cold blood cardioplegia is delivered initially in an antegrade fashion through the aortic root.   Iced saline slush is applied for topical hypothermia.  The initial cardioplegic arrest is rapid with early diastolic arrest.  Repeat doses of cardioplegia are administered intermittently throughout the entire cross clamp portion of the operation through the aortic root and through subsequently placed vein grafts in order to maintain completely flat electrocardiogram and septal myocardial temperature below 15C.  Myocardial protection was felt to be excellent.  Coronary Artery Bypass Grafting:   The posterior descending branch of the right  coronary artery was grafted using a reversed saphenous vein graft in an end-to-side fashion.  At the site of distal anastomosis the target vessel was poor quality and measured approximately 1.4 mm in diameter.  The first obtuse marginal branch of the left circumflex coronary artery was grafted using a reversed saphenous vein graft in an end-to-side fashion.  At the site of distal anastomosis the target vessel was diffusely diseased, fair quality and measured approximately 2.0 mm in diameter.  The diagonal branch of the left anterior descending coronary artery was grafted using a reversed saphenous vein graft in an end-to-side fashion.  At the site of distal anastomosis the target vessel was diffusely diseased, quality and measured approximately 1.5 mm in diameter.  The distal left anterior coronary artery was grafted with the left internal mammary artery in an end-to-side fashion.  At the site of distal anastomosis the target vessel was good quality and measured approximately 2.0 mm in diameter.  All proximal vein graft anastomoses were placed directly to the ascending aorta prior to removal of the aortic cross clamp.  The septal myocardial temperature rose rapidly after reperfusion of the left internal mammary artery graft.  The aortic cross clamp was removed after a total cross clamp time of 86 minutes.   Procedure Completion:  All proximal and distal coronary anastomoses were inspected for hemostasis and appropriate graft orientation. Epicardial pacing wires are fixed to the right ventricular outflow tract and to the right atrial appendage. The patient is rewarmed to 37C temperature. The patient is weaned and disconnected from cardiopulmonary bypass.  The patient's rhythm at separation from bypass was sinus.  The patient was weaned from cardiopulmonary bypass without any inotropic support. Total cardiopulmonary bypass time for the operation was 104 minutes.  Followup transesophageal echocardiogram  performed after separation from bypass revealed no changes from the preoperative exam.  The aortic and venous cannula  were removed uneventfully. Protamine was administered to reverse the anticoagulation. The mediastinum and pleural space were inspected for hemostasis and irrigated with saline solution. The mediastinum and the left pleural space were drained using 3 chest tubes placed through separate stab incisions inferiorly.  The soft tissues anterior to the aorta were reapproximated loosely. The sternum is closed with double strength sternal wire. The soft tissues anterior to the sternum were closed in multiple layers and the skin is closed with a running subcuticular skin closure.  The post-bypass portion of the operation was notable for stable rhythm and hemodynamics.  No blood products were administered during the operation.   Disposition:  The patient tolerated the procedure well and is transported to the surgical intensive care in stable condition. There are no intraoperative complications. All sponge instrument and needle counts are verified correct at completion of the operation.    Valentina Gu. Roxy Manns MD 11/26/2014 1:46 PM

## 2014-11-26 NOTE — Anesthesia Procedure Notes (Addendum)
Procedure Name: Intubation Date/Time: 11/26/2014 8:56 AM Performed by: Barrington Ellison Pre-anesthesia Checklist: Patient identified, Emergency Drugs available, Suction available, Patient being monitored and Timeout performed Patient Re-evaluated:Patient Re-evaluated prior to inductionOxygen Delivery Method: Circle system utilized Preoxygenation: Pre-oxygenation with 100% oxygen Intubation Type: IV induction Ventilation: Mask ventilation without difficulty Grade View: Grade I Tube type: Oral Tube size: 8.0 mm Number of attempts: 1 Airway Equipment and Method: Stylet Placement Confirmation: ETT inserted through vocal cords under direct vision,  breath sounds checked- equal and bilateral and positive ETCO2 Secured at: 22 cm Tube secured with: Tape Dental Injury: Teeth and Oropharynx as per pre-operative assessment  Comments: Intubation by Delman Cheadle, SRNA supervision by CRNA and MDA in room

## 2014-11-26 NOTE — Progress Notes (Signed)
  Echocardiogram Echocardiogram Transesophageal has been performed.  Bobbye Charleston 11/26/2014, 9:31 AM

## 2014-11-26 NOTE — Brief Op Note (Addendum)
11/19/2014 - 11/26/2014  12:20 PM  PATIENT:  Candice Butler  39 y.o. female  PRE-OPERATIVE DIAGNOSIS:  CAD  POST-OPERATIVE DIAGNOSIS:  CAD  PROCEDURE:  TRANSESOPHAGEAL ECHOCARDIOGRAM (TEE), MEDIAN STERNOTOMY for  CORONARY ARTERY BYPASS GRAFTING (CABG) TIMES FOUR (LIMA to LAD, SVG to DIAGONAL 2, SVG to OM, SVG to PDA) USING LEFT INTERNAL MAMMARY ARTERY AND RIGHT GREATER SAPHENOUS LEG VEIN HARVESTED ENDOSCOPICALLY    SURGEON:    Rexene Alberts, MD  ASSISTANTS:  Nani Skillern, PA-C  ANESTHESIA:   Duane Boston, MD  CROSSCLAMP TIME:   31'  CARDIOPULMONARY BYPASS TIME: 104'  FINDINGS:  Low-normal LV function  Diffuse CAD with fair quality target vessels for grafting  Small caliber but otherwise good quality LIMA conduit for grafting  Good quality SVG conduit for grafting  COMPLICATIONS: None  BASELINE WEIGHT: 109 kg  PATIENT DISPOSITION:   TO SICU IN STABLE CONDITION  Rexene Alberts 11/26/2014 1:41 PM

## 2014-11-26 NOTE — Procedures (Addendum)
Extubation Procedure Note  Patient Details:   Name: Candice Butler DOB: 1975/10/22 MRN: [id number]   Airway Documentation:  Airway 8 mm (Active)  Secured at (cm) 22 cm 11/26/2014  7:44 PM  Measured From Lips 11/26/2014  7:44 PM  Secured Location Right 11/26/2014  7:44 PM  Secured By Pink Tape 11/26/2014  7:44 PM  Site Condition Dry 11/26/2014  7:44 PM    Evaluation  O2 sats: stable throughout Complications: No apparent complications Patient did tolerate procedure well. Bilateral Breath Sounds: Rhonchi, Diminished Suctioning: Airway Yes    Patient performed parameters NIF -56 and VC 1.2 with a positive cuff leak. Patient was extubated to a 3 L [place].  Vitals within normal limits. Patient performed IS of 560mL.  Carrington Clamp A 11/26/2014, 8:52 PM

## 2014-11-26 NOTE — Anesthesia Postprocedure Evaluation (Signed)
Anesthesia Post Note  Patient: Henritta Mutz  Procedure(s) Performed: Procedure(s) (LRB): CORONARY ARTERY BYPASS GRAFTING (CABG) TIMES FOUR USING LEFT INTERNAL MAMMARY ARTERY AND RIGHT SAPHENOUS LEG VEIN HARVESTED ENDOSCOPICALLY (N/A) TRANSESOPHAGEAL ECHOCARDIOGRAM (TEE) (N/A)  Anesthesia type: General  Patient location: ICU  Post pain: Pain level controlled  Post assessment: Post-op Vital signs reviewed  Last Vitals:  Filed Vitals:   11/26/14 1630  BP:   Pulse: 90  Temp: 37.1 C  Resp: 18    Post vital signs: stable  Level of consciousness: Patient remains intubated per anesthesia plan  Complications: No apparent anesthesia complications

## 2014-11-27 ENCOUNTER — Encounter (HOSPITAL_COMMUNITY): Payer: Self-pay | Admitting: Thoracic Surgery (Cardiothoracic Vascular Surgery)

## 2014-11-27 ENCOUNTER — Inpatient Hospital Stay (HOSPITAL_COMMUNITY): Payer: Medicare Other

## 2014-11-27 DIAGNOSIS — Z951 Presence of aortocoronary bypass graft: Secondary | ICD-10-CM

## 2014-11-27 LAB — GLUCOSE, CAPILLARY
GLUCOSE-CAPILLARY: 116 mg/dL — AB (ref 65–99)
GLUCOSE-CAPILLARY: 134 mg/dL — AB (ref 65–99)
GLUCOSE-CAPILLARY: 87 mg/dL (ref 65–99)
Glucose-Capillary: 110 mg/dL — ABNORMAL HIGH (ref 65–99)
Glucose-Capillary: 129 mg/dL — ABNORMAL HIGH (ref 65–99)
Glucose-Capillary: 173 mg/dL — ABNORMAL HIGH (ref 65–99)

## 2014-11-27 LAB — CBC
HEMATOCRIT: 30.4 % — AB (ref 36.0–46.0)
HEMATOCRIT: 27.6 % — AB (ref 36.0–46.0)
Hemoglobin: 9.7 g/dL — ABNORMAL LOW (ref 12.0–15.0)
Hemoglobin: 9.1 g/dL — ABNORMAL LOW (ref 12.0–15.0)
MCH: 26.5 pg (ref 26.0–34.0)
MCH: 27.7 pg (ref 26.0–34.0)
MCHC: 31.9 g/dL (ref 30.0–36.0)
MCHC: 33 g/dL (ref 30.0–36.0)
MCV: 83.1 fL (ref 78.0–100.0)
MCV: 83.9 fL (ref 78.0–100.0)
PLATELETS: 210 10*3/uL (ref 150–400)
Platelets: 194 10*3/uL (ref 150–400)
RBC: 3.66 MIL/uL — ABNORMAL LOW (ref 3.87–5.11)
RBC: 3.29 MIL/uL — ABNORMAL LOW (ref 3.87–5.11)
RDW: 15.7 % — AB (ref 11.5–15.5)
RDW: 15.8 % — AB (ref 11.5–15.5)
WBC: 9.9 10*3/uL (ref 4.0–10.5)
WBC: 8.3 10*3/uL (ref 4.0–10.5)

## 2014-11-27 LAB — POCT I-STAT, CHEM 8
BUN: 11 mg/dL (ref 6–20)
CALCIUM ION: 1.14 mmol/L (ref 1.12–1.23)
CHLORIDE: 98 mmol/L — AB (ref 101–111)
Creatinine, Ser: 0.9 mg/dL (ref 0.44–1.00)
GLUCOSE: 111 mg/dL — AB (ref 65–99)
HCT: 30 % — ABNORMAL LOW (ref 36.0–46.0)
Hemoglobin: 10.2 g/dL — ABNORMAL LOW (ref 12.0–15.0)
POTASSIUM: 4.2 mmol/L (ref 3.5–5.1)
Sodium: 137 mmol/L (ref 135–145)
TCO2: 25 mmol/L (ref 0–100)

## 2014-11-27 LAB — BASIC METABOLIC PANEL
Anion gap: 10 (ref 5–15)
BUN: 9 mg/dL (ref 6–20)
CALCIUM: 7.8 mg/dL — AB (ref 8.9–10.3)
CO2: 23 mmol/L (ref 22–32)
CREATININE: 0.74 mg/dL (ref 0.44–1.00)
Chloride: 101 mmol/L (ref 101–111)
GFR calc Af Amer: >60 mL/min (ref >60)
GLUCOSE: 128 mg/dL — AB (ref 65–99)
Potassium: 4.3 mmol/L (ref 3.5–5.1)
Sodium: 134 mmol/L — ABNORMAL LOW (ref 135–145)

## 2014-11-27 LAB — MAGNESIUM
MAGNESIUM: 2 mg/dL (ref 1.7–2.4)
Magnesium: 2.1 mg/dL (ref 1.7–2.4)

## 2014-11-27 LAB — CREATININE, SERUM
Creatinine, Ser: 0.85 mg/dL (ref 0.44–1.00)
GFR calc Af Amer: >60 mL/min (ref >60)
GFR calc non Af Amer: >60 mL/min (ref >60)

## 2014-11-27 MED ORDER — VITAMIN D 1000 UNITS PO TABS
2000.0000 [IU] | ORAL_TABLET | Freq: Every day | ORAL | Status: DC
  Administered 2014-11-27 – 2014-12-02 (×6): 2000 [IU] via ORAL
  Filled 2014-11-27 (×6): qty 2

## 2014-11-27 MED ORDER — SODIUM CHLORIDE 0.9 % IJ SOLN: 9.0000 mL | INTRAMUSCULAR | Status: DC | PRN

## 2014-11-27 MED ORDER — DIPHENHYDRAMINE HCL 50 MG/ML IJ SOLN: 12.5000 mg | Freq: Four times a day (QID) | INTRAMUSCULAR | Status: DC | PRN

## 2014-11-27 MED ORDER — CITALOPRAM HYDROBROMIDE 20 MG PO TABS
10.0000 mg | ORAL_TABLET | Freq: Every day | ORAL | Status: DC
  Administered 2014-11-27 – 2014-12-02 (×6): 10 mg via ORAL
  Filled 2014-11-27 (×6): qty 1

## 2014-11-27 MED ORDER — ATORVASTATIN CALCIUM 80 MG PO TABS
80.0000 mg | ORAL_TABLET | Freq: Every day | ORAL | Status: DC
  Administered 2014-11-27 – 2014-12-02 (×6): 80 mg via ORAL
  Filled 2014-11-27 (×6): qty 1

## 2014-11-27 MED ORDER — NALOXONE HCL 0.4 MG/ML IJ SOLN: 0.4000 mg | INTRAMUSCULAR | Status: DC | PRN

## 2014-11-27 MED ORDER — METOPROLOL TARTRATE 25 MG PO TABS
25.0000 mg | ORAL_TABLET | Freq: Two times a day (BID) | ORAL | Status: DC
  Administered 2014-11-27 – 2014-12-01 (×8): 25 mg via ORAL
  Filled 2014-11-27 (×12): qty 1

## 2014-11-27 MED ORDER — PANTOPRAZOLE SODIUM 40 MG PO TBEC
80.0000 mg | DELAYED_RELEASE_TABLET | Freq: Every day | ORAL | Status: DC
Start: 2014-11-27 — End: 2014-12-02
  Administered 2014-11-27 – 2014-12-02 (×6): 80 mg via ORAL
  Filled 2014-11-27 (×6): qty 2

## 2014-11-27 MED ORDER — MORPHINE SULFATE (PF) 2 MG/ML IV SOLN
2.0000 mg | INTRAVENOUS | Status: DC | PRN
  Administered 2014-11-27: 2 mg via INTRAVENOUS
  Filled 2014-11-27 (×2): qty 1

## 2014-11-27 MED ORDER — DIPHENHYDRAMINE HCL 12.5 MG/5ML PO ELIX
12.5000 mg | ORAL_SOLUTION | Freq: Four times a day (QID) | ORAL | Status: DC | PRN
  Filled 2014-11-27: qty 5

## 2014-11-27 MED ORDER — MIDAZOLAM HCL 2 MG/2ML IJ SOLN: 2.0000 mg | Freq: Once | INTRAMUSCULAR | Status: DC

## 2014-11-27 MED ORDER — MORPHINE SULFATE 1 MG/ML IV SOLN
INTRAVENOUS | Status: DC
  Administered 2014-11-27 (×2): via INTRAVENOUS
  Administered 2014-11-27: 15 mg via INTRAVENOUS
  Administered 2014-11-28: 19:00:00 via INTRAVENOUS
  Administered 2014-11-28: 21 mg via INTRAVENOUS
  Administered 2014-11-28: 19.5 mg via INTRAVENOUS
  Administered 2014-11-28: 21 mg via INTRAVENOUS
  Administered 2014-11-28: via INTRAVENOUS
  Administered 2014-11-28: 18 mg via INTRAVENOUS
  Administered 2014-11-28 (×2): via INTRAVENOUS
  Administered 2014-11-29: 23.97 mg via INTRAVENOUS
  Administered 2014-11-29: 25.47 mg via INTRAVENOUS
  Administered 2014-11-29: 04:00:00 via INTRAVENOUS
  Filled 2014-11-27 (×9): qty 25

## 2014-11-27 MED ORDER — FENTANYL 25 MCG/HR TD PT72
25.0000 ug | MEDICATED_PATCH | TRANSDERMAL | Status: DC
  Administered 2014-11-27 – 2014-11-30 (×2): 25 ug via TRANSDERMAL
  Filled 2014-11-27 (×2): qty 1

## 2014-11-27 MED ORDER — CARISOPRODOL 350 MG PO TABS
350.0000 mg | ORAL_TABLET | Freq: Three times a day (TID) | ORAL | Status: DC | PRN
  Filled 2014-11-27 (×2): qty 1

## 2014-11-27 MED ORDER — FOLIC ACID 1 MG PO TABS
1.0000 mg | ORAL_TABLET | Freq: Every evening | ORAL | Status: DC
  Administered 2014-11-27 – 2014-12-01 (×5): 1 mg via ORAL
  Filled 2014-11-27 (×6): qty 1

## 2014-11-27 MED ORDER — LEVALBUTEROL HCL 0.63 MG/3ML IN NEBU
0.6300 mg | INHALATION_SOLUTION | Freq: Three times a day (TID) | RESPIRATORY_TRACT | Status: DC
  Administered 2014-11-27 (×2): 0.63 mg via RESPIRATORY_TRACT
  Filled 2014-11-27 (×3): qty 3

## 2014-11-27 MED ORDER — AMITRIPTYLINE HCL 75 MG PO TABS
150.0000 mg | ORAL_TABLET | Freq: Every day | ORAL | Status: DC
  Administered 2014-11-27 – 2014-11-28 (×2): 150 mg via ORAL
  Filled 2014-11-27 (×2): qty 2

## 2014-11-27 MED ORDER — FUROSEMIDE 10 MG/ML IJ SOLN
20.0000 mg | Freq: Three times a day (TID) | INTRAMUSCULAR | Status: AC
  Administered 2014-11-27 (×2): 20 mg via INTRAVENOUS
  Filled 2014-11-27 (×2): qty 2

## 2014-11-27 MED ORDER — KETOROLAC TROMETHAMINE 30 MG/ML IJ SOLN
30.0000 mg | Freq: Four times a day (QID) | INTRAMUSCULAR | Status: AC
  Administered 2014-11-27 – 2014-11-28 (×4): 30 mg via INTRAVENOUS
  Filled 2014-11-27 (×4): qty 1

## 2014-11-27 MED FILL — Potassium Chloride Inj 2 mEq/ML: INTRAVENOUS | Qty: 40 | Status: AC

## 2014-11-27 MED FILL — Heparin Sodium (Porcine) Inj 1000 Unit/ML: INTRAMUSCULAR | Qty: 30 | Status: AC

## 2014-11-27 MED FILL — Magnesium Sulfate Inj 50%: INTRAMUSCULAR | Qty: 10 | Status: AC

## 2014-11-27 NOTE — Progress Notes (Signed)
      [address]       [place] [address]             [phone]        CARDIOTHORACIC SURGERY PROGRESS NOTE   R1 Day Post-Op Procedure(s) (LRB): CORONARY ARTERY BYPASS GRAFTING (CABG) TIMES FOUR USING LEFT INTERNAL MAMMARY ARTERY AND RIGHT SAPHENOUS LEG VEIN HARVESTED ENDOSCOPICALLY (N/A) TRANSESOPHAGEAL ECHOCARDIOGRAM (TEE) (N/A)  Subjective: Complains of pain.  Wants IV morphine  Objective: Vital signs: BP Readings from Last 1 Encounters:  11/27/14 120/76   Pulse Readings from Last 1 Encounters:  11/27/14 110   Resp Readings from Last 1 Encounters:  11/27/14 25   Temp Readings from Last 1 Encounters:  11/27/14 98.1 F (36.7 C) Oral    Hemodynamics: PAP: (16-32)/(10-20) 18/12 mmHg CO:  [3.3 L/min-6.1 L/min] 6 L/min CI:  [1.5 L/min/m2-2.8 L/min/m2] 2.8 L/min/m2  Physical Exam:  Rhythm:   Sinus tach  Breath sounds: clear  Heart sounds:  RRR  Incisions:  Dressing dry, intact  Abdomen:  Soft, non-distended, non-tender  Extremities:  Warm, well-perfused  Chest tubes:  Low volume thin serosanguinous output, no air leak    Intake/Output from previous day: 09/14 0701 - 09/15 0700 In: 6690.4 [P.O.:210; I.V.:4256.8; Blood:973.7; NG/GT:50; IV Piggyback:1200] Out: 5993 [TTSVX:7939; Emesis/NG output:100; Blood:950; Chest Tube:400] Intake/Output this shift:    Lab Results:  CBC: Recent Labs  11/26/14 2000 11/26/14 2008 11/27/14 0423  WBC 10.4  --  9.9  HGB 10.1* 10.2* 9.7*  HCT 31.4* 30.0* 30.4*  PLT 206  --  210    BMET:  Recent Labs  11/25/14 0340  11/26/14 2008 11/27/14 0423  NA 135  < > 132* 134*  K 4.2  < > 4.6 4.3  CL 97*  < > 103 101  CO2 28  --   --  23  GLUCOSE 106*  < > 112* 128*  BUN 12  < > 12 9  CREATININE 1.02*  < > 0.70 0.74  CALCIUM 8.9  --   --  7.8*  < > = values in this interval not displayed.   PT/INR:   Recent Labs  11/26/14 1524  LABPROT 15.5*  INR 1.21    CBG (last 3)   Recent Labs   11/26/14 2205 11/26/14 2336 11/27/14 0339  GLUCAP 97 111* 110*    ABG    Component Value Date/Time   PHART 7.377 11/26/2014 2206   PCO2ART 44.0 11/26/2014 2206   PO2ART 128.0* 11/26/2014 2206   HCO3 25.7* 11/26/2014 2206   TCO2 27 11/26/2014 2206   ACIDBASEDEF 1.0 11/26/2014 1310   O2SAT 99.0 11/26/2014 2206    CXR: Mild bibasilar atelectasis  Assessment/Plan: S/P Procedure(s) (LRB): CORONARY ARTERY BYPASS GRAFTING (CABG) TIMES FOUR USING LEFT INTERNAL MAMMARY ARTERY AND RIGHT SAPHENOUS LEG VEIN HARVESTED ENDOSCOPICALLY (N/A) TRANSESOPHAGEAL ECHOCARDIOGRAM (TEE) (N/A)  Overall doing well POD1 Sinus tach and hypertensive, complaining of pain Expected post op acute blood loss anemia, mild Expected post op volume excess, mild Expected post op atelectasis, mild Chronic pain syndrome and narcotic use Chronic anxiety Long standing tobacco abuse   Mobilize  D/C tubes, lines, and foley  Try morphine PCA and low dose fentanyl patch for pain control   Rexene Alberts 11/27/2014 8:07 AM

## 2014-11-27 NOTE — Care Management Important Message (Signed)
Important Message  Patient Details  Name: Candice Butler MRN: [id number] Date of Birth: Oct 28, 1975   Medicare Important Message Given:  Yes-third notification given    Nathen May 11/27/2014, 3:41 PM

## 2014-11-28 ENCOUNTER — Inpatient Hospital Stay (HOSPITAL_COMMUNITY): Payer: Medicare Other

## 2014-11-28 LAB — GLUCOSE, CAPILLARY
GLUCOSE-CAPILLARY: 145 mg/dL — AB (ref 65–99)
Glucose-Capillary: 123 mg/dL — ABNORMAL HIGH (ref 65–99)
Glucose-Capillary: 103 mg/dL — ABNORMAL HIGH (ref 65–99)
Glucose-Capillary: 98 mg/dL (ref 65–99)
Glucose-Capillary: 94 mg/dL (ref 65–99)

## 2014-11-28 LAB — BASIC METABOLIC PANEL
Anion gap: 6 (ref 5–15)
BUN: 11 mg/dL (ref 6–20)
CHLORIDE: 101 mmol/L (ref 101–111)
CO2: 30 mmol/L (ref 22–32)
Calcium: 8.2 mg/dL — ABNORMAL LOW (ref 8.9–10.3)
Creatinine, Ser: 0.78 mg/dL (ref 0.44–1.00)
GFR calc non Af Amer: >60 mL/min (ref >60)
Glucose, Bld: 131 mg/dL — ABNORMAL HIGH (ref 65–99)
POTASSIUM: 3.9 mmol/L (ref 3.5–5.1)
SODIUM: 137 mmol/L (ref 135–145)

## 2014-11-28 LAB — CBC
HCT: 27.6 % — ABNORMAL LOW (ref 36.0–46.0)
Hemoglobin: 9.2 g/dL — ABNORMAL LOW (ref 12.0–15.0)
MCH: 28.2 pg (ref 26.0–34.0)
MCHC: 33.3 g/dL (ref 30.0–36.0)
MCV: 84.7 fL (ref 78.0–100.0)
Platelets: 190 10*3/uL (ref 150–400)
RBC: 3.26 MIL/uL — ABNORMAL LOW (ref 3.87–5.11)
RDW: 15.8 % — ABNORMAL HIGH (ref 11.5–15.5)
WBC: 7.5 10*3/uL (ref 4.0–10.5)

## 2014-11-28 MED ORDER — MOVING RIGHT ALONG BOOK
Freq: Once | Status: AC
  Administered 2014-11-28: 08:00:00
  Filled 2014-11-28: qty 1

## 2014-11-28 MED ORDER — LEVALBUTEROL HCL 0.63 MG/3ML IN NEBU: 0.6300 mg | INHALATION_SOLUTION | Freq: Four times a day (QID) | RESPIRATORY_TRACT | Status: DC | PRN

## 2014-11-28 MED ORDER — POTASSIUM CHLORIDE CRYS ER 20 MEQ PO TBCR
20.0000 meq | EXTENDED_RELEASE_TABLET | Freq: Two times a day (BID) | ORAL | Status: AC
  Administered 2014-11-28 – 2014-11-29 (×4): 20 meq via ORAL
  Filled 2014-11-28 (×4): qty 1

## 2014-11-28 MED ORDER — INSULIN ASPART 100 UNIT/ML ~~LOC~~ SOLN
0.0000 [IU] | Freq: Three times a day (TID) | SUBCUTANEOUS | Status: DC
  Administered 2014-11-28 – 2014-12-01 (×2): 2 [IU] via SUBCUTANEOUS

## 2014-11-28 MED ORDER — CARISOPRODOL 350 MG PO TABS
350.0000 mg | ORAL_TABLET | Freq: Three times a day (TID) | ORAL | Status: DC | PRN
  Administered 2014-11-28 – 2014-12-02 (×10): 350 mg via ORAL
  Filled 2014-11-28 (×12): qty 1

## 2014-11-28 MED ORDER — ENOXAPARIN SODIUM 30 MG/0.3ML ~~LOC~~ SOLN
30.0000 mg | SUBCUTANEOUS | Status: DC
  Administered 2014-11-29 – 2014-12-02 (×4): 30 mg via SUBCUTANEOUS
  Filled 2014-11-28 (×6): qty 0.3

## 2014-11-28 MED ORDER — SODIUM CHLORIDE 0.9 % IJ SOLN: 3.0000 mL | INTRAMUSCULAR | Status: DC | PRN

## 2014-11-28 MED ORDER — AMITRIPTYLINE HCL 50 MG PO TABS
150.0000 mg | ORAL_TABLET | Freq: Every day | ORAL | Status: DC
  Administered 2014-11-29 – 2014-12-01 (×3): 150 mg via ORAL
  Filled 2014-11-28 (×3): qty 3

## 2014-11-28 MED ORDER — SODIUM CHLORIDE 0.9 % IV SOLN: 250.0000 mL | INTRAVENOUS | Status: DC | PRN

## 2014-11-28 MED ORDER — SODIUM CHLORIDE 0.9 % IJ SOLN
3.0000 mL | Freq: Two times a day (BID) | INTRAMUSCULAR | Status: DC
  Administered 2014-11-28 – 2014-12-02 (×7): 3 mL via INTRAVENOUS

## 2014-11-28 MED ORDER — FUROSEMIDE 40 MG PO TABS
40.0000 mg | ORAL_TABLET | Freq: Two times a day (BID) | ORAL | Status: AC
  Administered 2014-11-28 – 2014-11-29 (×4): 40 mg via ORAL
  Filled 2014-11-28 (×4): qty 1

## 2014-11-28 NOTE — Progress Notes (Addendum)
      [address]       [place] [address]             [phone]        CARDIOTHORACIC SURGERY PROGRESS NOTE   R2 Days Post-Op Procedure(s) (LRB): CORONARY ARTERY BYPASS GRAFTING (CABG) TIMES FOUR USING LEFT INTERNAL MAMMARY ARTERY AND RIGHT SAPHENOUS LEG VEIN HARVESTED ENDOSCOPICALLY (N/A) TRANSESOPHAGEAL ECHOCARDIOGRAM (TEE) (N/A)  Subjective: Has required a very large amount of narcotics and non-narcotic pain relievers, but currently doing well.  Objective: Vital signs: BP Readings from Last 1 Encounters:  11/28/14 84/47   Pulse Readings from Last 1 Encounters:  11/28/14 114   Resp Readings from Last 1 Encounters:  11/28/14 17   Temp Readings from Last 1 Encounters:  11/28/14 98.2 F (36.8 C) Oral    Hemodynamics:    Physical Exam:  Rhythm:   Sinus tach  Breath sounds: clear  Heart sounds:  RRR  Incisions:  Dressing dry, intact  Abdomen:  Soft, non-distended, non-tender  Extremities:  Warm, well-perfused    Intake/Output from previous day: 09/15 0701 - 09/16 0700 In: 1244.1 [P.O.:360; I.V.:784.1; IV Piggyback:100] Out: 2660 [Urine:2620; Chest Tube:40] Intake/Output this shift:    Lab Results:  CBC: Recent Labs  11/27/14 1745 11/27/14 1752 11/28/14 0415  WBC 8.3  --  7.5  HGB 9.1* 10.2* 9.2*  HCT 27.6* 30.0* 27.6*  PLT 194  --  190    BMET:  Recent Labs  11/27/14 0423  11/27/14 1752 11/28/14 0415  NA 134*  --  137 137  K 4.3  --  4.2 3.9  CL 101  --  98* 101  CO2 23  --   --  30  GLUCOSE 128*  --  111* 131*  BUN 9  --  11 11  CREATININE 0.74  < > 0.90 0.78  CALCIUM 7.8*  --   --  8.2*  < > = values in this interval not displayed.   PT/INR:   Recent Labs  11/26/14 1524  LABPROT 15.5*  INR 1.21    CBG (last 3)   Recent Labs  11/27/14 1920 11/27/14 2335 11/28/14 0356  GLUCAP 173* 87 123*    ABG    Component Value Date/Time   PHART 7.377 11/26/2014 2206   PCO2ART 44.0 11/26/2014 2206   PO2ART  128.0* 11/26/2014 2206   HCO3 25.7* 11/26/2014 2206   TCO2 25 11/27/2014 1752   ACIDBASEDEF 1.0 11/26/2014 1310   O2SAT 99.0 11/26/2014 2206    CXR: Mild bibasilar atelectasis and possible small left effusion  Assessment/Plan: S/P Procedure(s) (LRB): CORONARY ARTERY BYPASS GRAFTING (CABG) TIMES FOUR USING LEFT INTERNAL MAMMARY ARTERY AND RIGHT SAPHENOUS LEG VEIN HARVESTED ENDOSCOPICALLY (N/A) TRANSESOPHAGEAL ECHOCARDIOGRAM (TEE) (N/A)  Overall doing well POD2 Maintaining NSR - sinus tach w/ stable BP O2 sats 94-96% on RA Expected post op acute blood loss anemia, mild, stable Expected post op atelectasis, mild Expected post op volume excess, mild, diuresing Chronic pain w/ long-standing narcotic dependence Hyperlipidemia Tobacco abuse Obesity   Mobilize  Diuresis  Continue PCA today - transition to oral pain meds tomorrow  Lovenox for DVT prophylaxis  Transfer stepdown  Possible d/c home 2-3 days   Candice Butler 11/28/2014 7:45 AM
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
## 2014-11-28 NOTE — Progress Notes (Signed)
Pt stable for transfer.  Report called to 2W, Nina, RN.  All questions answered.  Pt up to bathroom just prior to transport and she c/o discomfort/tightness in her right hand where her IV is.  It appears to be infiltrated.  Notified Gae Bon upon reaching the unit.  Pt has no personal belongings here, she stated her family took them home.

## 2014-11-29 ENCOUNTER — Inpatient Hospital Stay (HOSPITAL_COMMUNITY): Payer: Medicare Other

## 2014-11-29 LAB — BASIC METABOLIC PANEL
Anion gap: 8 (ref 5–15)
BUN: 14 mg/dL (ref 6–20)
CALCIUM: 8.6 mg/dL — AB (ref 8.9–10.3)
CHLORIDE: 100 mmol/L — AB (ref 101–111)
CO2: 28 mmol/L (ref 22–32)
CREATININE: 0.76 mg/dL (ref 0.44–1.00)
Glucose, Bld: 93 mg/dL (ref 65–99)
Potassium: 4.5 mmol/L (ref 3.5–5.1)
SODIUM: 136 mmol/L (ref 135–145)

## 2014-11-29 LAB — TYPE AND SCREEN
ABO/RH(D): B POS
ANTIBODY SCREEN: NEGATIVE
UNIT DIVISION: 0
UNIT DIVISION: 0
UNIT DIVISION: 0
Unit division: 0

## 2014-11-29 LAB — GLUCOSE, CAPILLARY
GLUCOSE-CAPILLARY: 98 mg/dL (ref 65–99)
GLUCOSE-CAPILLARY: 111 mg/dL — AB (ref 65–99)
Glucose-Capillary: 85 mg/dL (ref 65–99)

## 2014-11-29 LAB — CBC
HEMATOCRIT: 34.1 % — AB (ref 36.0–46.0)
Hemoglobin: 10.7 g/dL — ABNORMAL LOW (ref 12.0–15.0)
MCH: 27.4 pg (ref 26.0–34.0)
MCHC: 31.4 g/dL (ref 30.0–36.0)
MCV: 87.2 fL (ref 78.0–100.0)
PLATELETS: 214 10*3/uL (ref 150–400)
RBC: 3.91 MIL/uL (ref 3.87–5.11)
RDW: 15.7 % — ABNORMAL HIGH (ref 11.5–15.5)
WBC: 10.4 10*3/uL (ref 4.0–10.5)

## 2014-11-29 MED ORDER — ZOLPIDEM TARTRATE 5 MG PO TABS
5.0000 mg | ORAL_TABLET | Freq: Every evening | ORAL | Status: DC | PRN
  Administered 2014-11-30 – 2014-12-01 (×2): 5 mg via ORAL
  Filled 2014-11-29 (×2): qty 1

## 2014-11-29 MED ORDER — ALUM & MAG HYDROXIDE-SIMETH 200-200-20 MG/5ML PO SUSP: 30.0000 mL | Freq: Four times a day (QID) | ORAL | Status: DC | PRN

## 2014-11-29 MED ORDER — CLONAZEPAM 1 MG PO TABS
1.0000 mg | ORAL_TABLET | Freq: Three times a day (TID) | ORAL | Status: DC | PRN
  Administered 2014-11-29 – 2014-12-02 (×7): 1 mg via ORAL
  Filled 2014-11-29 (×7): qty 1
  Filled 2014-11-29: qty 2

## 2014-11-29 NOTE — Progress Notes (Signed)
Pt ambulated hall using wheelchair with 2L O2, approx 300 ft.  Moving well, slow and steady.  To bed after walk with call bell in reach.  Will con't plan of care.

## 2014-11-29 NOTE — Progress Notes (Signed)
Pt ambulated and walked the halls on her own with family members. Asked pt how the walk went and she mentioned that it went well. She reported no SOB, CP, nausea or dizziness. Pt practiced ISS. Expressed to pt to continue walking 2-3x/day.      Dorna Bloom, [place], ACSM [employer]

## 2014-11-29 NOTE — Progress Notes (Addendum)
[address]       [place] [address]             [phone]          3 Days Post-Op Procedure(s) (LRB): CORONARY ARTERY BYPASS GRAFTING (CABG) TIMES FOUR USING LEFT INTERNAL MAMMARY ARTERY AND RIGHT SAPHENOUS LEG VEIN HARVESTED ENDOSCOPICALLY (N/A) TRANSESOPHAGEAL ECHOCARDIOGRAM (TEE) (N/A)  Subjective: Tearful, hyperventilating this am. Anxious that we are stopping her PCA. C/o inability to sleep, constipation.    Objective: Vital signs in last 24 hours: Patient Vitals for the past 24 hrs:  BP Temp Temp src Pulse Resp SpO2 Weight  11/29/14 0747 - - - - 18 96 % -  11/29/14 0602 120/73 mmHg 97.6 F (36.4 C) Oral (!) 114 (!) 22 96 % -  11/29/14 0552 - - - - - - 247 lb 11.2 oz (112.356 kg)  11/29/14 0400 - - - - 15 97 % -  11/28/14 2308 - - - - 14 96 % -  11/28/14 2142 125/78 mmHg 98.9 F (37.2 C) Oral (!) 115 18 98 % -  11/28/14 2120 125/78 mmHg - - - - - -  11/28/14 1949 - - - - 16 95 % -  11/28/14 1849 - - - - 17 93 % -  11/28/14 1752 109/70 mmHg 98.1 F (36.7 C) Oral (!) 113 18 97 % -  11/28/14 1700 (!) 89/54 mmHg - - (!) 109 16 94 % -  11/28/14 1625 - 97.7 F (36.5 C) Oral - - - -  11/28/14 1600 (!) 86/58 mmHg - - (!) 106 15 94 % -  11/28/14 1500 (!) 86/66 mmHg - - (!) 112 (!) 23 94 % -  11/28/14 1400 98/65 mmHg - - 91 18 96 % -  11/28/14 1239 - - - - 17 - -  11/28/14 1200 (!) 85/63 mmHg 98.2 F (36.8 C) Oral (!) 108 17 90 % -  11/28/14 1100 96/62 mmHg - - (!) 106 16 92 % -  11/28/14 1000 109/68 mmHg - - (!) 125 20 92 % -  11/28/14 0900 - - - (!) 123 (!) 25 92 % -  11/28/14 0842 - 98.4 F (36.9 C) Oral - - - -   Current Weight  11/29/14 247 lb 11.2 oz (112.356 kg)  BASELINE WEIGHT:109 kg   Intake/Output from previous day: 09/16 0701 - 09/17 0700 In: 360 [P.O.:360] Out: 975 [Urine:975]  CBGs 145-98-93   PHYSICAL EXAM:  Heart: RRR, mildly tachy 110s Lungs: Clear Wound: Clean and dry Extremities: +LE edema    Lab  Results: CBC: Recent Labs  11/28/14 0415 11/29/14 0700  WBC 7.5 10.4  HGB 9.2* 10.7*  HCT 27.6* 34.1*  PLT 190 214   BMET:  Recent Labs  11/27/14 0423  11/27/14 1752 11/28/14 0415  NA 134*  --  137 137  K 4.3  --  4.2 3.9  CL 101  --  98* 101  CO2 23  --   --  30  GLUCOSE 128*  --  111* 131*  BUN 9  --  11 11  CREATININE 0.74  < > 0.90 0.78  CALCIUM 7.8*  --   --  8.2*  < > = values in this interval not displayed.  PT/INR:  Recent Labs  11/26/14 1524  LABPROT 15.5*  INR 1.21      Assessment/Plan: S/P Procedure(s) (LRB): CORONARY ARTERY BYPASS GRAFTING (CABG) TIMES FOUR USING LEFT INTERNAL  MAMMARY ARTERY AND RIGHT SAPHENOUS LEG VEIN HARVESTED ENDOSCOPICALLY (N/A) TRANSESOPHAGEAL ECHOCARDIOGRAM (TEE) (N/A)  Pain control seems to be her primary issue.  Plan is to d/c her PCA today and continue current narcotics as ordered.  Will resume Klonopin, which she takes prn for anxiety.  CV- Tachy low 100s, likely due to anxiety. SBPs low normal, so will not increase beta blocker further.  Expected postop blood loss anemia- H/H stable.  Vol overload- continue diuresis.  GI- LOC today prn constipation.  Ambulate, continue IS/pulm toilet.   LOS: 9 days    [name],[name] 11/29/2014  I have seen and examined Candice Butler and agree with the above assessment  and plan.  Grace Isaac MD Bee[facility] [phone] Office [phone] 11/29/2014 12:51 PM

## 2014-11-30 LAB — GLUCOSE, CAPILLARY
GLUCOSE-CAPILLARY: 102 mg/dL — AB (ref 65–99)
Glucose-Capillary: 87 mg/dL (ref 65–99)
Glucose-Capillary: 106 mg/dL — ABNORMAL HIGH (ref 65–99)
Glucose-Capillary: 107 mg/dL — ABNORMAL HIGH (ref 65–99)

## 2014-11-30 NOTE — Progress Notes (Signed)
Pt declines to walk at this time.  States she plans to walk with her daughter later.

## 2014-11-30 NOTE — Progress Notes (Signed)
Patient ID: Candice Butler, female   DOB: 1975-11-11, 39 y.o.   MRN: [id number]       [address]       [place] [address]             [phone]          4 Days Post-Op Procedure(s) (LRB): CORONARY ARTERY BYPASS GRAFTING (CABG) TIMES FOUR USING LEFT INTERNAL MAMMARY ARTERY AND RIGHT SAPHENOUS LEG VEIN HARVESTED ENDOSCOPICALLY (N/A) TRANSESOPHAGEAL ECHOCARDIOGRAM (TEE) (N/A)  Subjective: Off  PCA today, no complaints about pain meds today .  Objective: Vital signs in last 24 hours: Patient Vitals for the past 24 hrs:  BP Temp Temp src Pulse Resp SpO2 Weight  11/30/14 0500 - - - - - - 245 lb 4.8 oz (111.267 kg)  11/30/14 0436 (!) 93/57 mmHg 98.6 F (37 C) Oral (!) 110 19 97 % -  11/29/14 2051 (!) 98/59 mmHg - - - - - -  11/29/14 2013 100/68 mmHg 97.9 F (36.6 C) Oral (!) 106 17 100 % -  11/29/14 1458 (!) 105/58 mmHg 98.3 F (36.8 C) Oral (!) 109 18 99 % -   Current Weight  11/30/14 245 lb 4.8 oz (111.267 kg)  BASELINE WEIGHT:109 kg   Intake/Output from previous day: 09/17 0701 - 09/18 0700 In: 120 [P.O.:120] Out: 700 [Urine:700]  CBGs 145-98-93   PHYSICAL EXAM:  Heart: RRR, mildly tachy 110s Lungs: Clear Wound: Clean and dry Extremities: +LE edema    Lab Results: CBC:  Recent Labs  11/28/14 0415 11/29/14 0700  WBC 7.5 10.4  HGB 9.2* 10.7*  HCT 27.6* 34.1*  PLT 190 214   BMET:   Recent Labs  11/28/14 0415 11/29/14 0700  NA 137 136  K 3.9 4.5  CL 101 100*  CO2 30 28  GLUCOSE 131* 93  BUN 11 14  CREATININE 0.78 0.76  CALCIUM 8.2* 8.6*    PT/INR: No results for input(s): LABPROT, INR in the last 72 hours.    Assessment/Plan: S/P Procedure(s) (LRB): CORONARY ARTERY BYPASS GRAFTING (CABG) TIMES FOUR USING LEFT INTERNAL MAMMARY ARTERY AND RIGHT SAPHENOUS LEG VEIN HARVESTED ENDOSCOPICALLY (N/A) TRANSESOPHAGEAL ECHOCARDIOGRAM (TEE) (N/A)  Pain control better today  CV- Tachy low 100s, likely due to anxiety. SBPs low  normal, limits dosage of betablocker   Expected postop blood loss anemia- H/H stable.  Vol overload- continue diuresis.   Ambulate, continue IS/pulm toilet.   LOS: 10 days    Grace Isaac 11/30/2014 .

## 2014-12-01 ENCOUNTER — Other Ambulatory Visit: Payer: Self-pay | Admitting: Family Medicine

## 2014-12-01 LAB — GLUCOSE, CAPILLARY: Glucose-Capillary: 124 mg/dL — ABNORMAL HIGH (ref 65–99)

## 2014-12-01 MED ORDER — CLONAZEPAM 1 MG PO TABS: 1.0000 mg | ORAL_TABLET | Freq: Three times a day (TID) | ORAL | Status: AC | PRN

## 2014-12-01 MED ORDER — CARISOPRODOL 350 MG PO TABS: 350.0000 mg | ORAL_TABLET | Freq: Three times a day (TID) | ORAL | Status: AC | PRN

## 2014-12-01 MED ORDER — CHLORHEXIDINE GLUCONATE 0.12 % MT SOLN
OROMUCOSAL | Status: AC
  Filled 2014-12-01: qty 15

## 2014-12-01 MED ORDER — POTASSIUM CHLORIDE CRYS ER 20 MEQ PO TBCR
20.0000 meq | EXTENDED_RELEASE_TABLET | Freq: Every day | ORAL | Status: DC
  Administered 2014-12-01 – 2014-12-02 (×2): 20 meq via ORAL
  Filled 2014-12-01 (×2): qty 1

## 2014-12-01 MED ORDER — FUROSEMIDE 40 MG PO TABS
40.0000 mg | ORAL_TABLET | Freq: Every day | ORAL | Status: DC
  Administered 2014-12-01 – 2014-12-02 (×2): 40 mg via ORAL
  Filled 2014-12-01 (×2): qty 1

## 2014-12-01 NOTE — Telephone Encounter (Signed)
Patient called stating she had a surgery and that she needs a med refill on carisoprodol (SOMA) 350 MG tablet. Patient stated she would like a quantity of 90. Please f/u

## 2014-12-01 NOTE — Telephone Encounter (Signed)
Answered phone. Patient calling from hospital Hospitalized for CAD s/p bypass Requesting the following: Soma 90 for TID use Klonopin 90 for TID use Ambien 10 mg at night  Reports getting all of the above along with oxycodone in the hospital currently. Reports that her cardiologist suggested that I should be able to continue patient on the above listed meds, excluding oxycodone Patient prescribed tylenol #3 by me  Plan: Refilled soma, I will prescribe this ongoing Refilled klonopin x one month, I will not prescribe this ongoing as per clinic policy I do not prescribe chronic BZ and chronic pain medicine. Once again, directed patient to mental health. Refused ambien given the risk of over-sedation.  Patient advised to send daughter to pick up refills and list of mental health resources

## 2014-12-01 NOTE — Progress Notes (Signed)
CARDIAC REHAB PHASE I   PRE:  Rate/Rhythm: 98 SR  BP:  Sitting: 95/62        SaO2: 96 RA  MODE:  Ambulation: 150 ft   POST:  Rate/Rhythm: 121 ST  BP:  Sitting: 114/69         SaO2: 95 RA  Pt c/o incisional pain 7/10, sitting up in bed, removed her own oxygen. Pt sats 96% on RA. Pt ambulated 150 ft on RA, rolling walker, slow, steady gait, standby assist, tolerated well. Pt denies dizziness, DOE, sats 97% on RA while ambulating, tachycardiac, HR 110s-120s ST. HR returned to baseline with rest. Pt declined to ambulate farther, asking for pain meds. Left on RA. RN notified. Pt to bed per pt request after walk, call bell within reach. Encouraged up in recliner, increased ambulation and IS, pt verbalized understanding.  [phone]  Lenna Sciara, RN, BSN 12/01/2014 9:17 AM

## 2014-12-01 NOTE — Care Management Note (Signed)
Case Management Note  Patient Details  Name: [place] Dalporto MRN: [id number] Date of Birth: [birth year]  Subjective/Objective:     Pt admitted s/p CABG               Action/Plan: PTA pt lived at home - independent- anticipate return home- pt goes to [facility]   Expected Discharge Date:      12/02/14            Expected Discharge Plan:  Home/Self Care  In-House Referral:     Discharge planning Services     Post Acute Care Choice:    Choice offered to:     DME Arranged:    DME Agency:     HH Arranged:    [place] Agency:     Status of Service:  In process, will continue to follow  Medicare Important Message Given:  Yes-third notification given Date Medicare IM Given:    Medicare IM give by:    Date Additional Medicare IM Given:    Additional Medicare Important Message give by:     If discussed at [place] of Stay Meetings, dates discussed:  12/02/14  Additional Comments:  Dawayne Patricia, RN 12/01/2014, 10:44 AM

## 2014-12-01 NOTE — Progress Notes (Signed)
Removed epicardial wires per order. 3 intact.  Pt tolerated procedure well.  Pt instructed to remain on bedrest for one hour.  Frequent vitals will be taken and documented. Pt resting with call bell within reach. Burton, Sandra McClintock, RN   

## 2014-12-01 NOTE — Progress Notes (Addendum)
      [address]       [place] [address]             [phone]        5 Days Post-Op Procedure(s) (LRB): CORONARY ARTERY BYPASS GRAFTING (CABG) TIMES FOUR USING LEFT INTERNAL MAMMARY ARTERY AND RIGHT SAPHENOUS LEG VEIN HARVESTED ENDOSCOPICALLY (N/A) TRANSESOPHAGEAL ECHOCARDIOGRAM (TEE) (N/A)  Subjective: Patient with incisional pain  Objective: Vital signs in last 24 hours: Temp:  [98.1 F (36.7 C)-99.2 F (37.3 C)] 98.6 F (37 C) (09/19 0448) Pulse Rate:  [90-107] 107 (09/19 0448) Cardiac Rhythm:  [-] Normal sinus rhythm (09/18 1900) Resp:  [16-18] 16 (09/19 0448) BP: (93-111)/(56-66) 105/63 mmHg (09/19 0448) SpO2:  [96 %-99 %] 96 % (09/19 0448) Weight:  [243 lb 14.4 oz (110.632 kg)] 243 lb 14.4 oz (110.632 kg) (09/19 0448)  Pre op weight 109 kg Current Weight  12/01/14 243 lb 14.4 oz (110.632 kg)      Intake/Output from previous day: 09/18 0701 - 09/19 0700 In: 240 [P.O.:240] Out: -    Physical Exam:  Cardiovascular: RRR Pulmonary: Clear to auscultation bilaterally; no rales, wheezes, or rhonchi. Abdomen: Soft, non tender, bowel sounds present. Extremities: Mild bilateral lower extremity edema. Ecchymosis inner right thigh Wounds: Clean and dry.  No erythema or signs of infection.  Lab Results: CBC: Recent Labs  11/29/14 0700  WBC 10.4  HGB 10.7*  HCT 34.1*  PLT 214   BMET:  Recent Labs  11/29/14 0700  NA 136  K 4.5  CL 100*  CO2 28  GLUCOSE 93  BUN 14  CREATININE 0.76  CALCIUM 8.6*    PT/INR:  Lab Results  Component Value Date   INR 1.21 11/26/2014   INR 0.93 11/25/2014   INR 1.0 11/14/2014   ABG:  INR: Will add last result for INR, ABG once components are confirmed Will add last 4 CBG results once components are confirmed  Assessment/Plan:  1. CV - Occasional ST. SR in the 90's this am. On Lopressor 25 mg bid. Unable to start ACE secondary to labile BP 2.  Pulmonary - On 2 liters of oxygen via [place]. Wean  to room air. Encourage incentive spirometer 3. Volume Overload - On Lasix 40 mg daily 4.  Acute blood loss anemia - Last H and H 10.7 and 34.1 5. Low grade fever to 99.2. Likely atelectasis. No sign of wound infection. 6. Remove EPW 7. CBGs 106/107/124. No history of DM. Will stop accu checks and SS PRN 8. Possible discharge in am  ZIMMERMAN,DONIELLE MPA-C 12/01/2014,7:24 AM  I have seen and examined the patient and agree with the assessment and plan as outlined.  Tentatively plan d/c home tomorrow.  Rexene Alberts 12/01/2014 9:15 AM

## 2014-12-01 NOTE — Progress Notes (Signed)
Utilization review completed.  

## 2014-12-01 NOTE — Care Management Important Message (Signed)
Important Message  Patient Details  Name: Candice Butler MRN: [id number] Date of Birth: March 14, 1976   Medicare Important Message Given:  Yes-fourth notification given    Nathen May 12/01/2014, 2:47 PM

## 2014-12-02 MED ORDER — ATORVASTATIN CALCIUM 80 MG PO TABS: 80.0000 mg | ORAL_TABLET | Freq: Every day | ORAL | Status: DC

## 2014-12-02 MED ORDER — ASPIRIN 325 MG PO TBEC: 325.0000 mg | DELAYED_RELEASE_TABLET | Freq: Every day | ORAL | Status: AC

## 2014-12-02 MED ORDER — OXYCODONE HCL 5 MG PO TABS: 5.0000 mg | ORAL_TABLET | ORAL | Status: DC | PRN

## 2014-12-02 MED ORDER — METOPROLOL TARTRATE 25 MG PO TABS: 25.0000 mg | ORAL_TABLET | Freq: Two times a day (BID) | ORAL | Status: AC

## 2014-12-02 MED ORDER — PROMETHAZINE HCL 25 MG/ML IJ SOLN
12.5000 mg | Freq: Four times a day (QID) | INTRAMUSCULAR | Status: DC | PRN
  Administered 2014-12-02 (×2): 12.5 mg via INTRAVENOUS
  Filled 2014-12-02 (×2): qty 1

## 2014-12-02 NOTE — Progress Notes (Addendum)
      [address]       [place] [address]             [phone]        6 Days Post-Op Procedure(s) (LRB): CORONARY ARTERY BYPASS GRAFTING (CABG) TIMES FOUR USING LEFT INTERNAL MAMMARY ARTERY AND RIGHT SAPHENOUS LEG VEIN HARVESTED ENDOSCOPICALLY (N/A) TRANSESOPHAGEAL ECHOCARDIOGRAM (TEE) (N/A)  Subjective: Patient sleeping-she was awakened. She has no specific complaints at this time.  Objective: Vital signs in last 24 hours: Temp:  [97.9 F (36.6 C)-98.2 F (36.8 C)] 98.1 F (36.7 C) (09/20 0417) Pulse Rate:  [86-96] 96 (09/20 0417) Cardiac Rhythm:  [-] Normal sinus rhythm (09/19 1930) Resp:  [18] 18 (09/20 0417) BP: (91-103)/(53-66) 91/58 mmHg (09/20 0417) SpO2:  [93 %-97 %] 94 % (09/20 0417) Weight:  [241 lb (109.317 kg)] 241 lb (109.317 kg) (09/20 0417)  Pre op weight 109 kg Current Weight  12/02/14 241 lb (109.317 kg)      Intake/Output from previous day: 09/19 0701 - 09/20 0700 In: 480 [P.O.:480] Out: 2250 [Urine:2250]   Physical Exam:  Cardiovascular: RRR Pulmonary: Clear to auscultation bilaterally; no rales, wheezes, or rhonchi. Abdomen: Soft, non tender, bowel sounds present. Extremities: Trace lower extremity edema. Ecchymosis inner right thigh Wounds: Clean and dry.  No erythema or signs of infection.  Lab Results: CBC:No results for input(s): WBC, HGB, HCT, PLT in the last 72 hours. BMET: No results for input(s): NA, K, CL, CO2, GLUCOSE, BUN, CREATININE, CALCIUM in the last 72 hours.  PT/INR:  Lab Results  Component Value Date   INR 1.21 11/26/2014   INR 0.93 11/25/2014   INR 1.0 11/14/2014   ABG:  INR: Will add last result for INR, ABG once components are confirmed Will add last 4 CBG results once components are confirmed  Assessment/Plan:  1. CV - SR in the 90's. SR in the 90's this am. On Lopressor 25 mg bid. Unable to start ACE secondary to labile BP 2.  Pulmonary - On room air. Encourage incentive spirometer 3.  Volume Overload - On Lasix 40 mg daily 4.  Acute blood loss anemia - Last H and H 10.7 and 34.1n. 5. Will discharge later today  ZIMMERMAN,DONIELLE MPA-C 12/02/2014,7:59 AM    I have seen and examined the patient and agree with the assessment and plan as outlined.  Rexene Alberts 12/02/2014 10:33 AM

## 2014-12-02 NOTE — Progress Notes (Addendum)
[id number] Education completed with pt who voiced understanding. Encouraged IS and walking for pulmonary status. Discussed smoking cessation and gave handout and fake cigarette. Encouraged pt to call [phone] as needed. Pt sounds doubtful about quitting. Discussed heart healthy diet as cholesterol and LDL extremely high. Pt stated she is going back to [place] on October 3 so I did not refer to CRP 2 here. She stated her mother had made her an appt with cardiologist in [place]. Discussed CRP 2 with pt and highly recommended that she get her cardiologist to refer her to program there. She stated has watched post op video. Graylon Good RN BSN 12/02/2014 10:10 AM

## 2014-12-02 NOTE — Progress Notes (Signed)
Pt/family given discharge instructions, medication lists, follow up appointments, and when to call the doctor.  Pt/family verbalizes understanding. Pt given signs and symptoms of infection. Burton, Sandra McClintock, RN    

## 2014-12-09 ENCOUNTER — Encounter: Payer: Self-pay | Admitting: Cardiology

## 2014-12-10 ENCOUNTER — Telehealth: Payer: Self-pay | Admitting: Physician Assistant

## 2014-12-10 NOTE — Telephone Encounter (Signed)
Error

## 2014-12-11 ENCOUNTER — Other Ambulatory Visit: Payer: Self-pay | Admitting: *Deleted

## 2014-12-11 ENCOUNTER — Telehealth: Payer: Self-pay | Admitting: Family Medicine

## 2014-12-11 ENCOUNTER — Telehealth: Payer: Self-pay | Admitting: Physician Assistant

## 2014-12-11 DIAGNOSIS — G8918 Other acute postprocedural pain: Secondary | ICD-10-CM

## 2014-12-11 MED ORDER — HYDROCODONE-ACETAMINOPHEN 10-325 MG PO TABS: 1.0000 | ORAL_TABLET | Freq: Four times a day (QID) | ORAL | Status: AC | PRN

## 2014-12-11 NOTE — Telephone Encounter (Signed)
Patient recently received oxycodone Rx from Dr. Tacy Dura  I will be unable to refill tylenol #3 as long as she is receiving oxycodone

## 2014-12-11 NOTE — Telephone Encounter (Signed)
Patient -Called she was pain -she ran out of her oxycotin She need something- for incisional and to help her move around after surgery.  patient states she is not having chest pain but incisional pain.  RN ASKED HER TO CONTACT DR Ricard Dillon OFFICE OR GO TO [place].  SHE STATES SHE WILL CONTACT OFFICE  RN GAVE PATIENT PHONE NUMBER

## 2014-12-11 NOTE — Telephone Encounter (Signed)
Patient called to request a med refill for Tylenol #3. Patient would like the quantity changed from #60 to #90 so she can take 3 pills a day.  Please f/u with pt.

## 2014-12-11 NOTE — Progress Notes (Signed)
Ms. Matkins has called for a pain med refill s/p CABG 11/26/14.  She was discharged on "oxy", but is requesting "Lortab 10/325 like she got in [place] for her back".  I said we could probably do that, but it would only be short term during her recuperation from her heart surgery. I told her a new signed script would be available today at the office and she agreed.  Dr. Servando Snare signed the script.

## 2014-12-12 ENCOUNTER — Telehealth: Payer: Self-pay | Admitting: Physician Assistant

## 2014-12-12 NOTE — Telephone Encounter (Signed)
New problem   Candice Butler want to know status of fax that was sent ver for lt knee brace that need signature from [employer]. Please advise

## 2014-12-12 NOTE — Telephone Encounter (Signed)
Pt calling to follow up on medication refill. Please f/u with pt.

## 2014-12-15 ENCOUNTER — Encounter: Payer: Self-pay | Admitting: Physician Assistant

## 2014-12-15 ENCOUNTER — Ambulatory Visit (INDEPENDENT_AMBULATORY_CARE_PROVIDER_SITE_OTHER): Payer: Medicare Other | Admitting: Physician Assistant

## 2014-12-15 VITALS — BP 112/60 | HR 99 | Ht 67.0 in | Wt 244.7 lb

## 2014-12-15 DIAGNOSIS — E785 Hyperlipidemia, unspecified: Secondary | ICD-10-CM | POA: Diagnosis not present

## 2014-12-15 DIAGNOSIS — R000 Tachycardia, unspecified: Secondary | ICD-10-CM

## 2014-12-15 DIAGNOSIS — I2510 Atherosclerotic heart disease of native coronary artery without angina pectoris: Secondary | ICD-10-CM | POA: Diagnosis not present

## 2014-12-15 DIAGNOSIS — Z951 Presence of aortocoronary bypass graft: Secondary | ICD-10-CM

## 2014-12-15 DIAGNOSIS — E038 Other specified hypothyroidism: Secondary | ICD-10-CM | POA: Diagnosis not present

## 2014-12-15 DIAGNOSIS — R1011 Right upper quadrant pain: Secondary | ICD-10-CM
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
## 2014-12-15 NOTE — Patient Instructions (Signed)
Find a Primary Care Physician ASAP upon arriving in [place].

## 2014-12-18 NOTE — Telephone Encounter (Signed)
I s/w pt today about knee brace request. I advised pt that she will need to get her PCP to send in for knee brace. Pt then states to me that she has just moved back to [place], [place]. I advised pt she will need to d/w PCP there in [place], pt said ok.

## 2014-12-22 ENCOUNTER — Telehealth: Payer: Self-pay | Admitting: Family Medicine

## 2014-12-22 ENCOUNTER — Other Ambulatory Visit: Payer: Self-pay

## 2014-12-22 NOTE — Telephone Encounter (Signed)
carisoprodol (SOMA) 350 MG tablet Tylenol #3  Patient moved to [place] on the 3rd of this month and scheduled an appointment with her PCP but her appointment is not until 3 months from today and is hoping to get a refill for both of the above mentioned medications. Please follow up with pt.   Pt uses [facility] [address], [place] [phone].  Thank you.

## 2014-12-25 NOTE — Telephone Encounter (Signed)
LVM to return call.

## 2014-12-25 NOTE — Telephone Encounter (Signed)
Patient called back in response to VM about medication refills:  carisoprodol (SOMA) 350 MG tablet  Tylenol #3 Klonopin 1MG  (will run out soon)   Patient had open heart surgery two weeks ago and is currently completely out of her medications. Please f/u with patient ASAP.

## 2014-12-26 ENCOUNTER — Other Ambulatory Visit: Payer: Self-pay | Admitting: Thoracic Surgery (Cardiothoracic Vascular Surgery)

## 2014-12-26 DIAGNOSIS — Z951 Presence of aortocoronary bypass graft: Secondary | ICD-10-CM

## 2014-12-26 NOTE — Telephone Encounter (Signed)
Pt advised to call for transfer  Rx soma was given on 12/01/2014 with 2 rf  Pt was prescribed Norco on 12/11/2014 #40, no Tylenol #3 will be refill at this time  Klonopin was given on 12/01/2014 Pt was argumentative  after informing  Unable to refill medication at this time Phone call was tranfers to supervisor

## 2014-12-29 ENCOUNTER — Encounter: Payer: Medicare Other | Admitting: Thoracic Surgery (Cardiothoracic Vascular Surgery)

## 2014-12-30 ENCOUNTER — Telehealth: Payer: Self-pay | Admitting: *Deleted

## 2015-01-28 ENCOUNTER — Other Ambulatory Visit: Payer: Self-pay | Admitting: Family Medicine

## 2015-01-28 DIAGNOSIS — K219 Gastro-esophageal reflux disease without esophagitis: Secondary | ICD-10-CM

## 2015-01-28 DIAGNOSIS — E039 Hypothyroidism, unspecified: Secondary | ICD-10-CM

## 2015-01-28 MED ORDER — NEXIUM 40 MG PO CPDR: 40.0000 mg | DELAYED_RELEASE_CAPSULE | Freq: Two times a day (BID) | ORAL | Status: AC

## 2015-01-28 MED ORDER — LEVOTHYROXINE SODIUM 75 MCG PO TABS: 75.0000 ug | ORAL_TABLET | Freq: Every day | ORAL | Status: DC

## 2015-01-30 ENCOUNTER — Other Ambulatory Visit: Payer: Self-pay | Admitting: Family Medicine

## 2015-01-30 DIAGNOSIS — E039 Hypothyroidism, unspecified: Secondary | ICD-10-CM

## 2015-01-30 MED ORDER — LEVOTHYROXINE SODIUM 75 MCG PO TABS: 75.0000 ug | ORAL_TABLET | Freq: Every day | ORAL | Status: AC

## 2015-04-25 ENCOUNTER — Other Ambulatory Visit: Payer: Self-pay | Admitting: Physician Assistant

## 2015-04-28 ENCOUNTER — Other Ambulatory Visit: Payer: Self-pay | Admitting: Physician Assistant

## 2015-04-28 NOTE — Telephone Encounter (Signed)
Rx request sent to pharmacy.  

## 2017-06-05 IMAGING — CR DG LUMBAR SPINE 2-3V
3 series · 3 of 3 positions shown · non-contrast
Comparison: None.

CLINICAL DATA: Chronic low back pain

EXAM:
LUMBAR SPINE - 2-3 VIEW

[t l-spine a.p.]
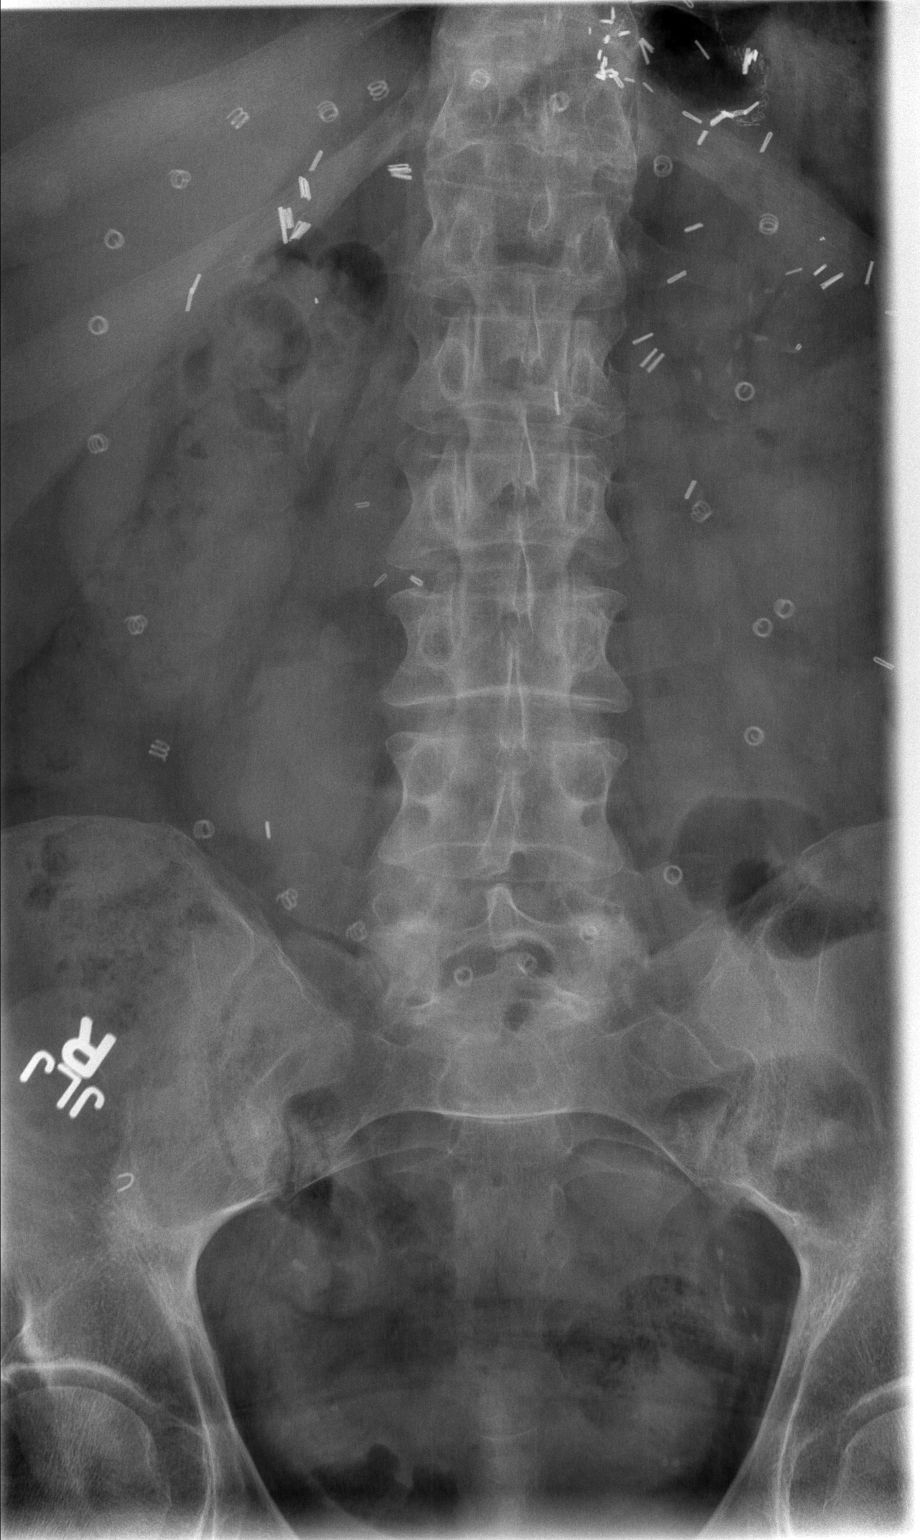

[t l-spine lat (1 of 2)]
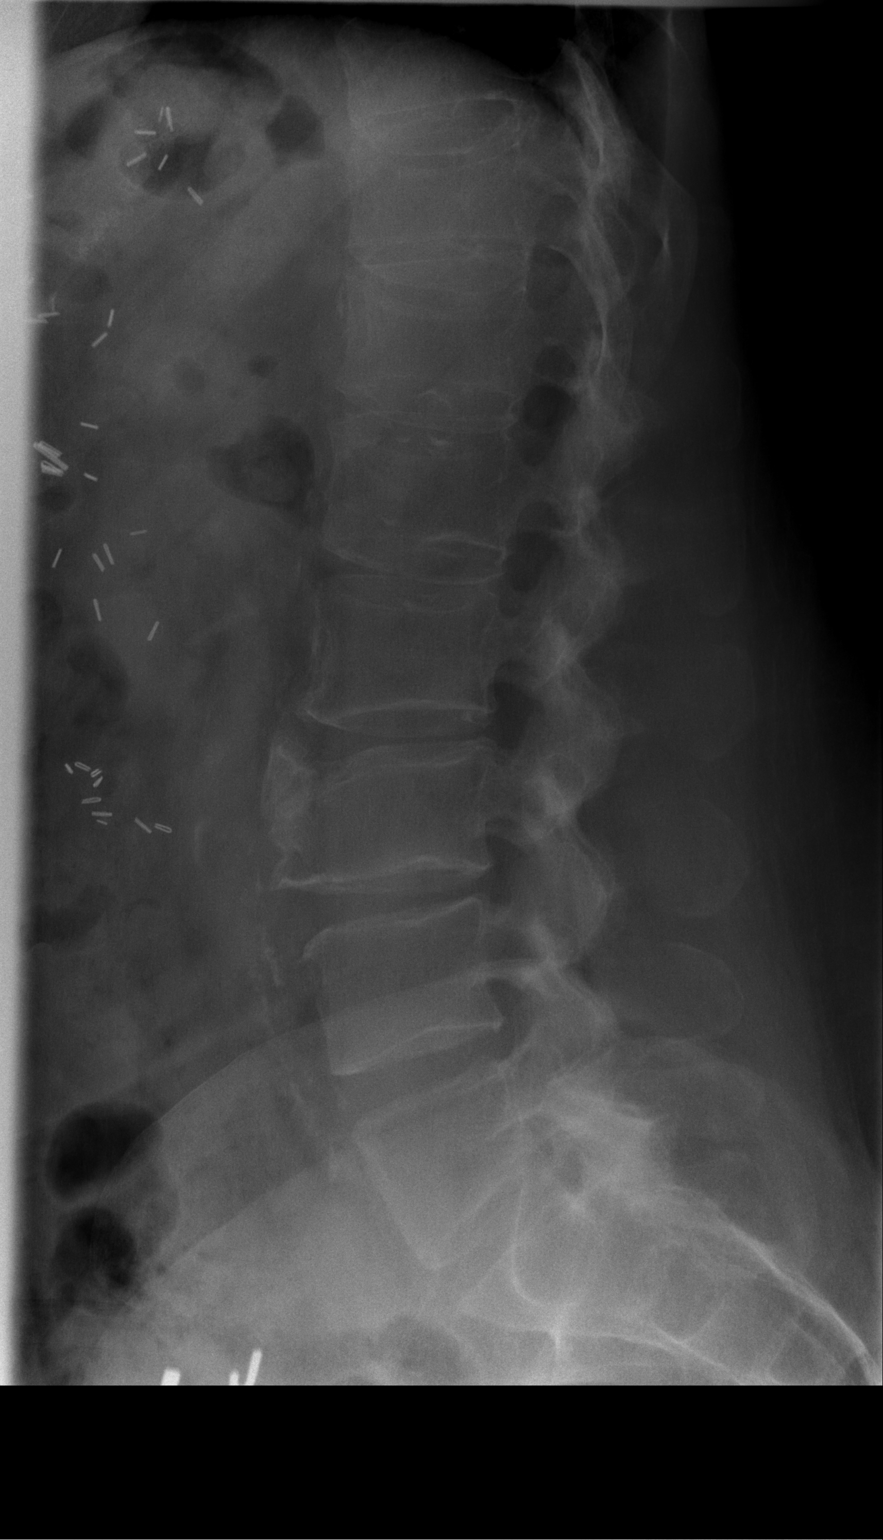

[t l-spine lat (2 of 2)]
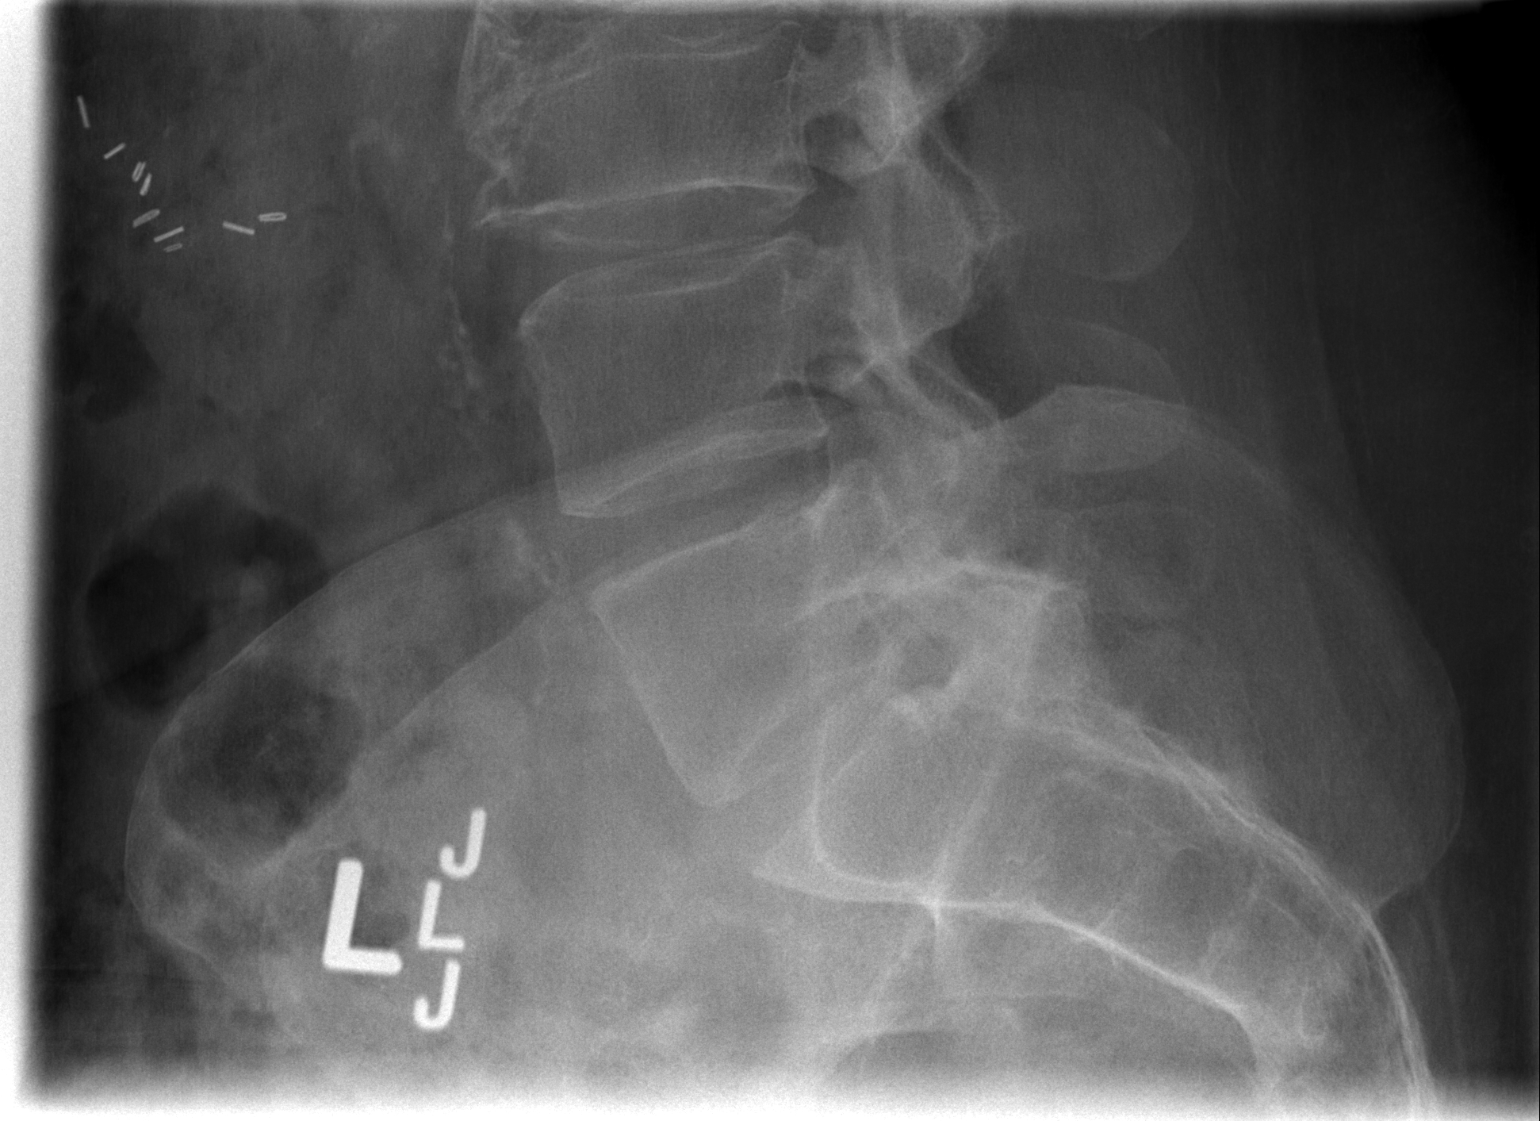

[3 of 3 positions shown; findings below may reference images not displayed]

FINDINGS: Five lumbar type vertebral bodies are well visualized. Irregularity
of the superior anterior aspect of L3 is noted consistent with the
patient's given clinical history of prior compression fracture. Mild
aortic calcifications are noted. No anterolisthesis is seen. The
overlying bowel gas pattern is within normal limits.
IMPRESSION: Chronic changes at L3.  No acute abnormality noted.
